# Patient Record
Sex: Female | Born: 1970 | Race: White | Hispanic: No | Marital: Married | Smoking: Never smoker
Health system: Southern US, Community
[De-identification: ages and names within clinical notes are randomized; demographics above are authoritative.]

## PROBLEM LIST (undated history)

## (undated) DIAGNOSIS — Z8679 Personal history of other diseases of the circulatory system: Secondary | ICD-10-CM

## (undated) DIAGNOSIS — E079 Disorder of thyroid, unspecified: Secondary | ICD-10-CM

## (undated) DIAGNOSIS — Z8719 Personal history of other diseases of the digestive system: Secondary | ICD-10-CM

## (undated) HISTORY — DX: Personal history of other diseases of the circulatory system: Z86.79

## (undated) HISTORY — DX: Disorder of thyroid, unspecified: E07.9

## (undated) HISTORY — PX: TONSILLECTOMY AND ADENOIDECTOMY: SUR1326

## (undated) HISTORY — PX: OTHER SURGICAL HISTORY: SHX169

## (undated) HISTORY — DX: Personal history of other diseases of the digestive system: Z87.19

---

## 2004-12-28 ENCOUNTER — Other Ambulatory Visit: Admission: RE | Admit: 2004-12-28 | Discharge: 2004-12-28 | Payer: Self-pay | Admitting: Gynecology

## 2005-12-30 ENCOUNTER — Other Ambulatory Visit: Admission: RE | Admit: 2005-12-30 | Discharge: 2005-12-30 | Payer: Self-pay | Admitting: Gynecology

## 2007-01-05 ENCOUNTER — Other Ambulatory Visit: Admission: RE | Admit: 2007-01-05 | Discharge: 2007-01-05 | Payer: Self-pay | Admitting: Gynecology

## 2008-03-06 ENCOUNTER — Encounter (INDEPENDENT_AMBULATORY_CARE_PROVIDER_SITE_OTHER): Payer: Self-pay | Admitting: Obstetrics and Gynecology

## 2008-03-06 ENCOUNTER — Inpatient Hospital Stay (HOSPITAL_COMMUNITY): Admission: RE | Admit: 2008-03-06 | Discharge: 2008-03-08 | Payer: Self-pay | Admitting: Obstetrics and Gynecology

## 2009-09-26 ENCOUNTER — Ambulatory Visit (HOSPITAL_COMMUNITY): Admission: RE | Admit: 2009-09-26 | Discharge: 2009-09-26 | Payer: Self-pay | Admitting: Obstetrics and Gynecology

## 2010-04-29 ENCOUNTER — Encounter: Admission: RE | Admit: 2010-04-29 | Discharge: 2010-04-29 | Payer: Self-pay | Admitting: Internal Medicine

## 2010-11-11 LAB — CBC
HCT: 39 % (ref 36.0–46.0)
Hemoglobin: 13.4 g/dL (ref 12.0–15.0)
RBC: 4.19 MIL/uL (ref 3.87–5.11)
RDW: 12.4 % (ref 11.5–15.5)
WBC: 6 10*3/uL (ref 4.0–10.5)

## 2011-01-05 NOTE — Op Note (Signed)
[name], Preston            ACCOUNT NO.:  [id number]   MEDICAL RECORD NO.:  [id number]          PATIENT TYPE:  INP   LOCATION:  9145                          FACILITY:  [facility]   PHYSICIAN:  Maxie Better, M.D.DATE OF BIRTH:  1971/07/23   DATE OF PROCEDURE:  03/06/2008  DATE OF DISCHARGE:                               OPERATIVE REPORT   PREOPERATIVE DIAGNOSES:  Term gestation, previous cesarean section x2,  and desires sterilization.   PROCEDURES:  Repeat cesarean section, Sharl Ma hysterotomy, modified Pomeroy  tubal ligation.   POSTOPERATIVE DIAGNOSES:  Previous cesarean section x2, term gestation,  and desires sterilization   ANESTHESIA:  Spinal.   SURGEON:  Maxie Better, MD   ASSISTANT:  Marlinda Mike, CNM   PROCEDURE:  Under adequate spinal anesthesia, the patient was placed in  the supine position with a left lateral tilt.  She was sterilely prepped  and draped in the usual fashion.  An indwelling Foley catheter was  sterilely placed.  A 0.25% Marcaine was injected along the planned  Pfannenstiel incision site.  Pfannenstiel skin incision was then made  and carried down to the rectus fascia.  Rectus fascia was opened  transversely.  The rectus fascia was then bluntly and sharply dissected  off the rectus muscle in superior and inferior fashion.  The rectus  muscles were split in midline.  The parietal peritoneum was opened  sharply and some adhesions was noted.  Careful dissection superiorly and  inferiorly was then performed, resulting in a small portion of the  serosal surface of the uterus being attached to the left anterior  abdominal wall and this was lysed in order to facilitate the surgery.  Additional adhesions on the left lower quadrant of the incision was also  lysed.  The vesicouterine peritoneum was then opened transversely.  The  bladder was then bluntly dissected off the lower uterine segment and  displaced inferiorly.  A curvilinear  low-transverse uterine incision was  then made and extended with bandage scissors.  Artificial rupture of  membranes and clear copious fluid was noted.  Subsequent delivery of a  live female from the left occiput transverse position was accomplished.  Baby was bulb suctioned in the abdomen.  Cord was clamped and cut.  The  baby was transferred to the awaiting pediatrician who assigned Apgars of  9 and 9 at 1 and 5.  Weight of the baby was ultimately 10 pounds.  Uterine incision was closed with a single layer of running locked stitch  of Monocryl with hemostasis noted.  Attention was then turned to the  fallopian tubes.  Both tubes were noted to be normal.  Both ovaries were  also normal.  The midportion of both fallopian tubes was grasped with a  single-tooth tenaculum.  The underlying mesosalpinx was opened with a  cautery.  The proximal and distal portion of the tube was then tied with  0 chromic x2 proximally and distally.  The intervening segment of tube  was then removed bilaterally.  The abdomen was then copiously irrigated  and suctioned of debris.  Re-inspection of the uterine incision showed  good  hemostasis.  The parietal peritoneum was closed with 2-0 Vicryl.  The rectus fascia was closed with 0 Vicryl x2.  The skin was  approximated using Ethicon staples after the subcutaneous area was  irrigated and small bleeders cauterized.   SPECIMENS:  Portion of right and left fallopian tubes sent to pathology.  Placenta not sent.   ESTIMATED BLOOD LOSS:  800 mL.   URINE OUTPUT:  100 mL, clear yellow urine.   INTRAOPERATIVE FLUID:  3 liters.   SPONGE AND INSTRUMENT COUNTS:  X2 was correct.   COMPLICATIONS:  None.   The patient tolerated the procedure well and was transferred to the  recovery room in stable condition.      Maxie Better, M.D.  Electronically Signed     [place]/MEDQ  D:  03/06/2008  T:  03/07/2008  Job:  [id number]

## 2011-01-08 NOTE — Discharge Summary (Signed)
Sherry Preston, Sherry Preston            ACCOUNT NO.:  [id number]   MEDICAL RECORD NO.:  [id number]          PATIENT TYPE:  INP   LOCATION:  9145                          [facility]ILITY:  [facility]   PHYSICIAN:  Maxie Better, M.D.DATE OF BIRTH:  [birth year]   DATE OF ADMISSION:  03/06/2008  DATE OF DISCHARGE:  03/08/2008                               DISCHARGE SUMMARY   ADMISSION DIAGNOSES:  1. Previous cesarean section.  2. Desired sterilization.  3. Term gestation.   DISCHARGE DIAGNOSES:  1. Term gestation, delivered.  2. Desired sterilization.  3. Previous cesarean section.   PROCEDURES:  1. Repeat cesarean section.  2. Modified Pomeroy tubal ligation.   HOSPITAL COURSE:  A 40 year old gravida 3, para 2 female at term  admitted for repeat cesarean section and tubal ligation.  The patient  had surgery done.  See the dictated operative report for the specific  details.  A live female, 10 pounds, Apgars of 9 and 9 was delivered.  The  patient had an uncomplicated postoperative course.  Her CBC on postop  day #1 showed a hemoglobin of 11.4, hematocrit of 33, white count of  10.2, and platelet count of 216,000.  On postop day #2, the patient  requested early discharge.  She was tolerating a regular diet.  She was  deemed likely to be able to go home.   DISPOSITION:  To home.   CONDITION:  Stable.   DISCHARGE MEDICATIONS:  1. Percocet 1-2 tablets every 3-4 hours p.r.n. pain.  2. Prenatal vitamins 1 p.o. daily.   FOLLOWUP APPOINTMENT:  [facility] in 3 days for staple removal and  in 6 weeks for postpartum checkup.   DISCHARGE INSTRUCTIONS:  Per the postpartum booklet given.      Maxie Better, M.D.  Electronically Signed     [place]/MEDQ  D:  03/28/2008  T:  03/28/2008  Job:  [id number]

## 2011-05-21 LAB — CBC
HCT: 33 — ABNORMAL LOW
HCT: 41.6
Hemoglobin: 11.4 — ABNORMAL LOW
Hemoglobin: 14.6
MCHC: 35
MCV: 96
MCV: 97.4
Platelets: 216
RBC: 4.33
RDW: 12.8
RDW: 13.1

## 2011-05-21 LAB — CCBB MATERNAL DONOR DRAW

## 2011-06-09 ENCOUNTER — Other Ambulatory Visit: Payer: Self-pay | Admitting: Family Medicine

## 2011-06-09 DIAGNOSIS — E049 Nontoxic goiter, unspecified: Secondary | ICD-10-CM

## 2011-06-14 ENCOUNTER — Ambulatory Visit
Admission: RE | Admit: 2011-06-14 | Discharge: 2011-06-14 | Disposition: A | Discharge status: Home / Self Care | Payer: BC Managed Care – PPO | Source: Ambulatory Visit | Attending: Family Medicine | Admitting: Family Medicine

## 2011-06-14 DIAGNOSIS — E049 Nontoxic goiter, unspecified: Secondary | ICD-10-CM

## 2011-06-16 ENCOUNTER — Other Ambulatory Visit: Payer: Self-pay

## 2012-06-26 ENCOUNTER — Other Ambulatory Visit: Payer: Self-pay | Admitting: Internal Medicine

## 2012-06-26 DIAGNOSIS — E049 Nontoxic goiter, unspecified: Secondary | ICD-10-CM

## 2012-07-04 ENCOUNTER — Ambulatory Visit
Admission: RE | Admit: 2012-07-04 | Discharge: 2012-07-04 | Disposition: A | Discharge status: Home / Self Care | Payer: BC Managed Care – PPO | Source: Ambulatory Visit | Attending: Internal Medicine | Admitting: Internal Medicine

## 2012-07-04 DIAGNOSIS — E049 Nontoxic goiter, unspecified: Secondary | ICD-10-CM

## 2013-01-24 ENCOUNTER — Other Ambulatory Visit: Payer: Self-pay | Admitting: Internal Medicine

## 2013-01-24 DIAGNOSIS — E049 Nontoxic goiter, unspecified: Secondary | ICD-10-CM

## 2013-02-15 ENCOUNTER — Ambulatory Visit
Admission: RE | Admit: 2013-02-15 | Discharge: 2013-02-15 | Disposition: A | Discharge status: Home / Self Care | Payer: BC Managed Care – PPO | Source: Ambulatory Visit | Attending: Internal Medicine | Admitting: Internal Medicine

## 2013-02-15 DIAGNOSIS — E049 Nontoxic goiter, unspecified: Secondary | ICD-10-CM

## 2013-03-26 ENCOUNTER — Other Ambulatory Visit: Payer: Self-pay | Admitting: Gastroenterology

## 2013-03-26 ENCOUNTER — Ambulatory Visit
Admission: RE | Admit: 2013-03-26 | Discharge: 2013-03-26 | Disposition: A | Discharge status: Home / Self Care | Payer: BC Managed Care – PPO | Source: Ambulatory Visit | Attending: Gastroenterology | Admitting: Gastroenterology

## 2013-03-26 DIAGNOSIS — R935 Abnormal findings on diagnostic imaging of other abdominal regions, including retroperitoneum: Secondary | ICD-10-CM

## 2013-03-26 DIAGNOSIS — R1031 Right lower quadrant pain: Secondary | ICD-10-CM

## 2013-03-26 MED ORDER — IOHEXOL 300 MG/ML  SOLN
100.0000 mL | Freq: Once | INTRAMUSCULAR | Status: AC | PRN
  Administered 2013-03-26: 100 mL via INTRAVENOUS

## 2013-03-30 ENCOUNTER — Other Ambulatory Visit: Payer: BC Managed Care – PPO

## 2013-03-31 ENCOUNTER — Ambulatory Visit
Admission: RE | Admit: 2013-03-31 | Discharge: 2013-03-31 | Disposition: A | Discharge status: Home / Self Care | Payer: BC Managed Care – PPO | Source: Ambulatory Visit | Attending: Gastroenterology | Admitting: Gastroenterology

## 2013-03-31 DIAGNOSIS — R1031 Right lower quadrant pain: Secondary | ICD-10-CM

## 2013-03-31 DIAGNOSIS — R935 Abnormal findings on diagnostic imaging of other abdominal regions, including retroperitoneum: Secondary | ICD-10-CM

## 2013-03-31 MED ORDER — GADOXETATE DISODIUM 0.25 MMOL/ML IV SOLN
7.0000 mL | Freq: Once | INTRAVENOUS | Status: AC | PRN
  Administered 2013-03-31: 7 mL via INTRAVENOUS

## 2013-04-02 ENCOUNTER — Other Ambulatory Visit: Payer: BC Managed Care – PPO

## 2014-08-01 IMAGING — CT CT ABD-PELV W/ CM
2 of 5 series · 16 of 46 positions shown, 18 images · IV contrast (READICAT/WATER & [ID] OMNI 300)
Comparison: No priors.

CLINICAL DATA: Right lower quadrant abdominal pain.

CT ABDOMEN AND PELVIS WITH CONTRAST
TECHNIQUE: Multidetector CT imaging of the abdomen and pelvis was
performed following the standard protocol during bolus
administration of intravenous contrast.
Contrast: 100mL OMNIPAQUE IOHEXOL 300 MG/ML  SOLN

[Series 2: abd/pelvis with · axial · 0.70mm/px · z∈[-296,+89]mm · 13 of 87 slices shown, 15 images]
[im 5/87  soft-tissue]
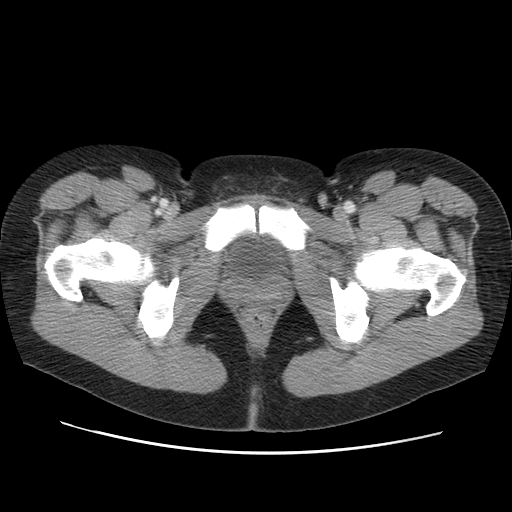
[im 5/87  bone]
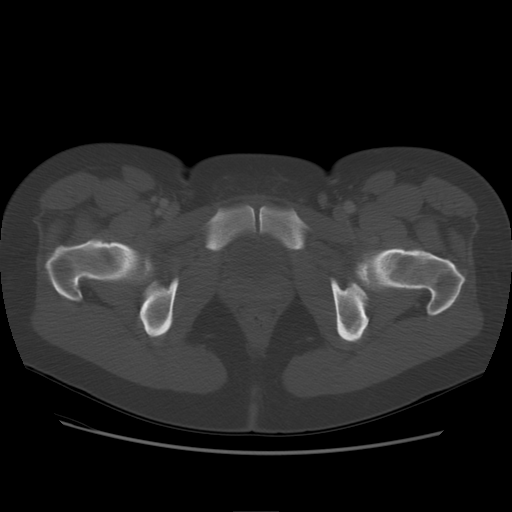
[im 14/87  soft-tissue]
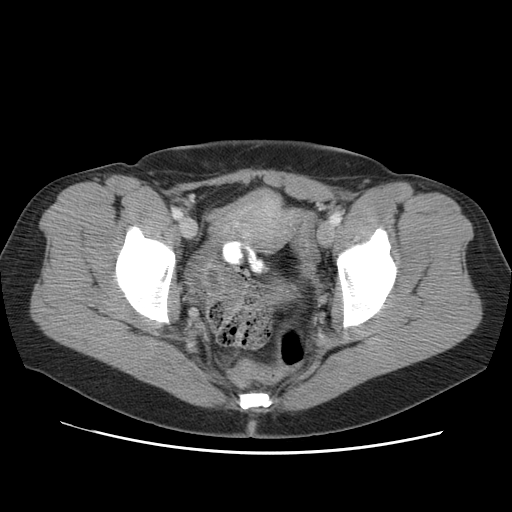
[im 19/87  soft-tissue]
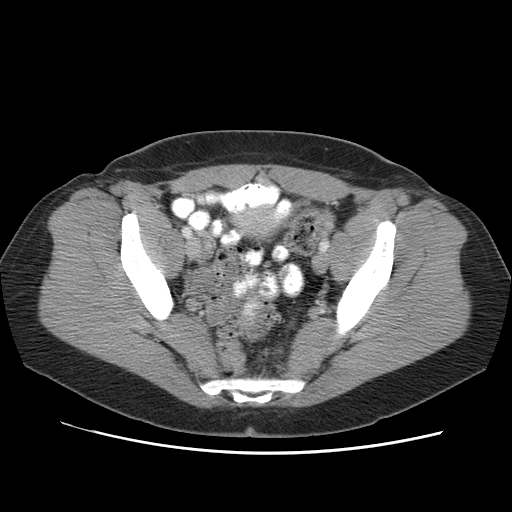
[im 23/87  soft-tissue]
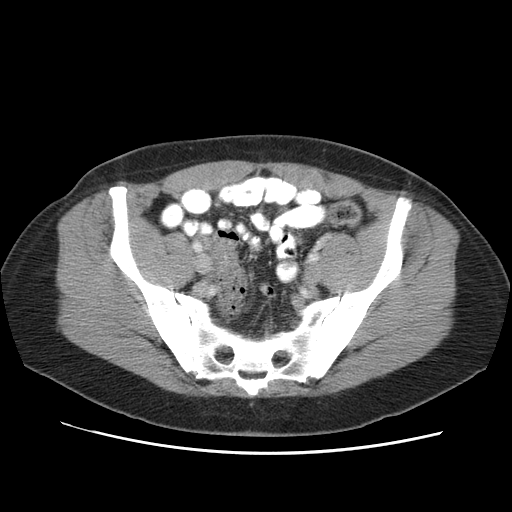
[im 32/87  soft-tissue]
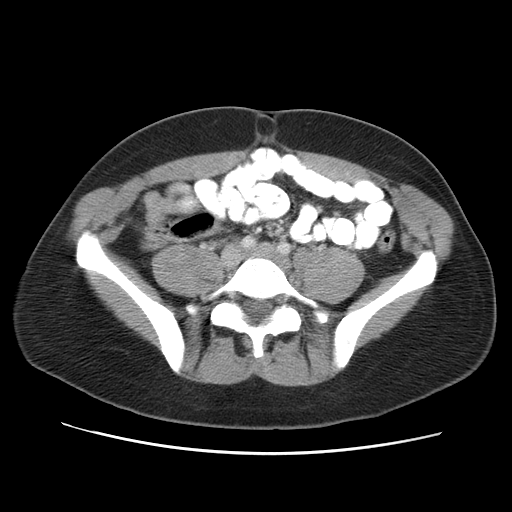
[im 37/87  soft-tissue]
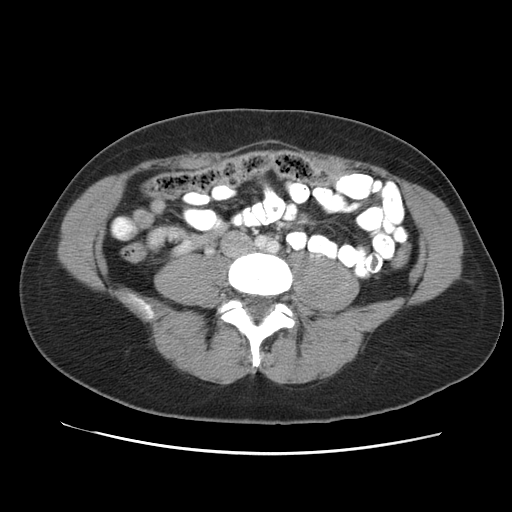
[im 46/87  soft-tissue]
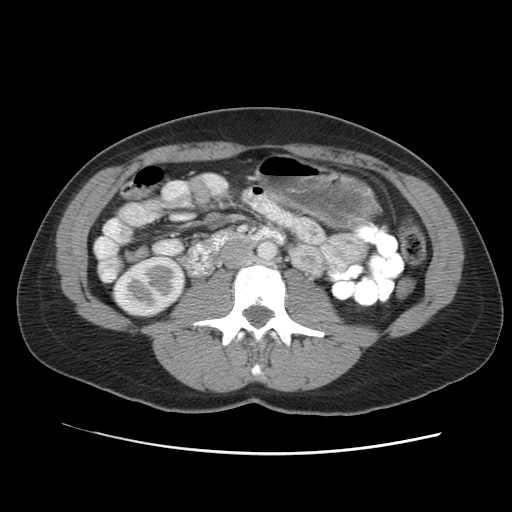
[im 50/87  soft-tissue]
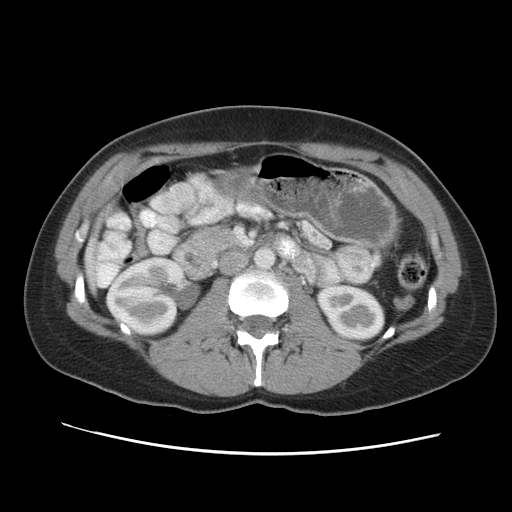
[im 55/87  soft-tissue]
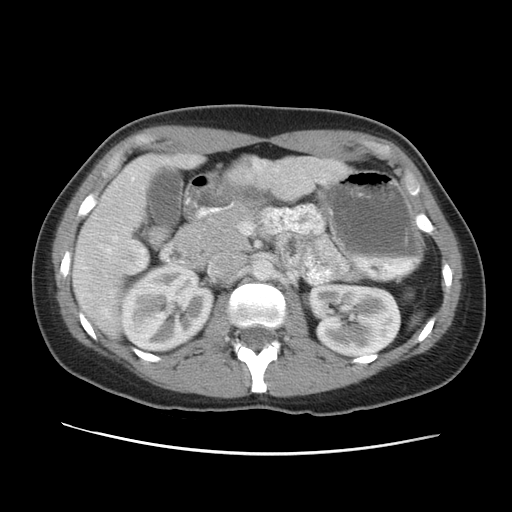
[im 55/87  bone]
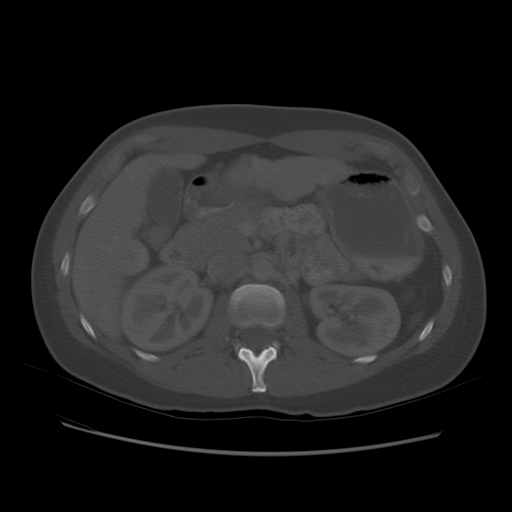
[im 64/87  soft-tissue]
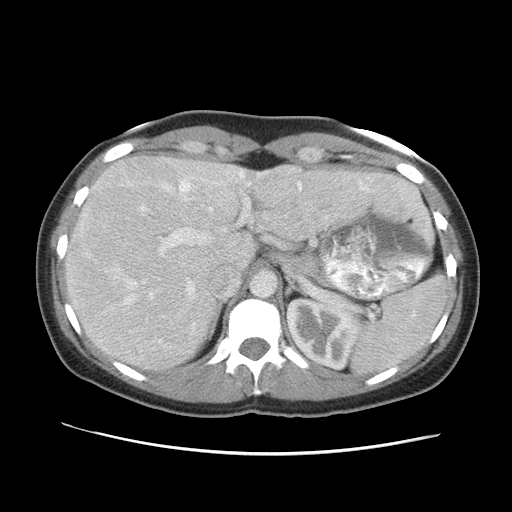
[im 68/87  soft-tissue]
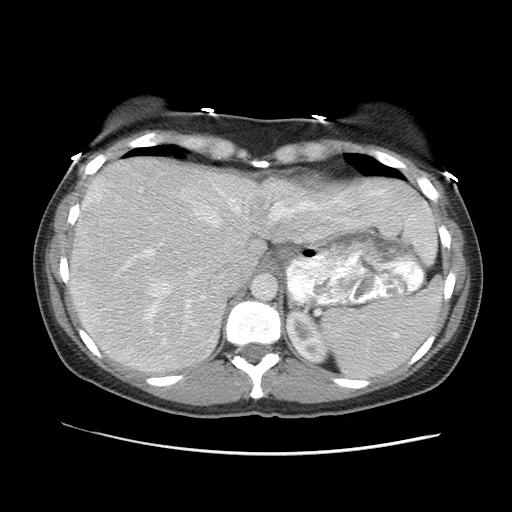
[im 73/87  soft-tissue]
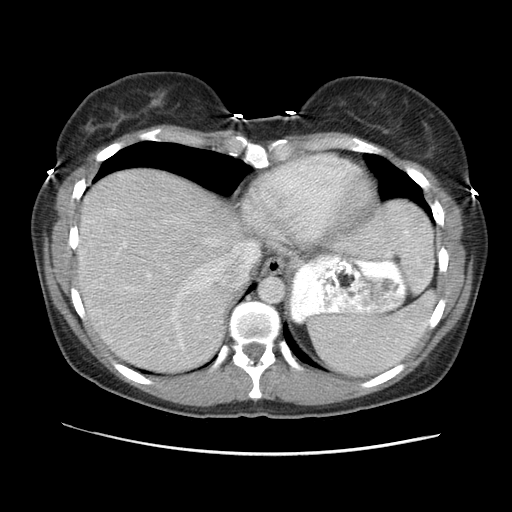
[im 82/87  soft-tissue]
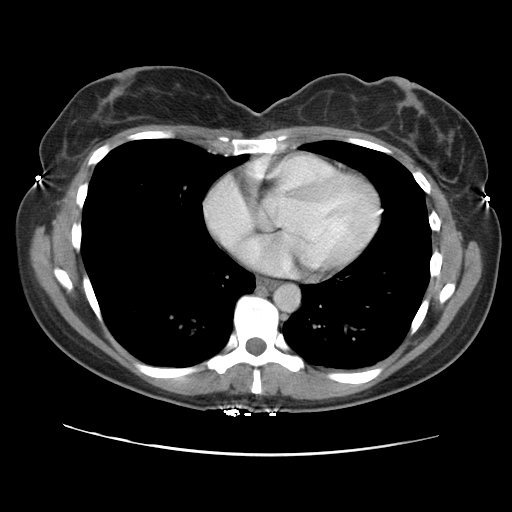

[Series 400: coronal · coronal · 0.95mm/px · 3 of 104 slices shown]
[im 35/104  soft-tissue]
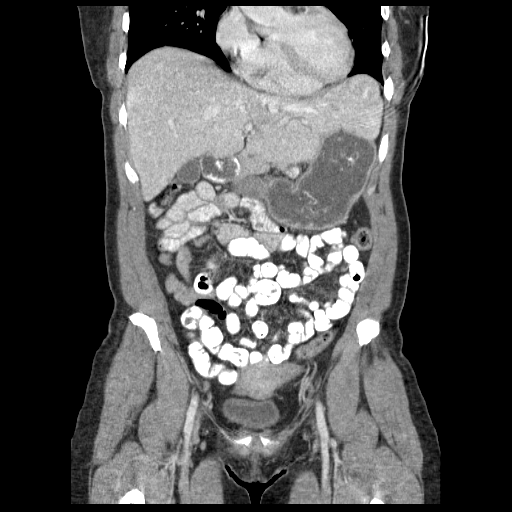
[im 46/104  soft-tissue]
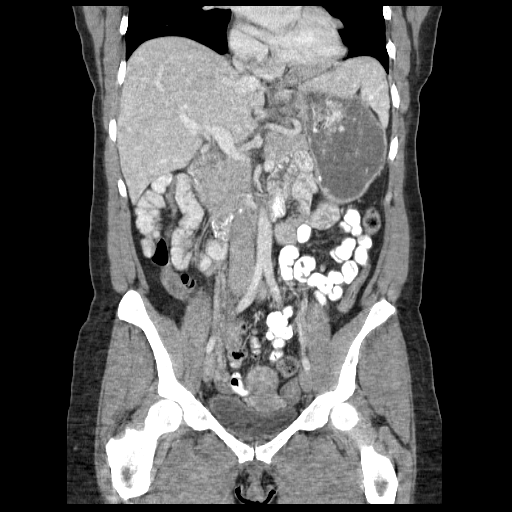
[im 58/104  soft-tissue]
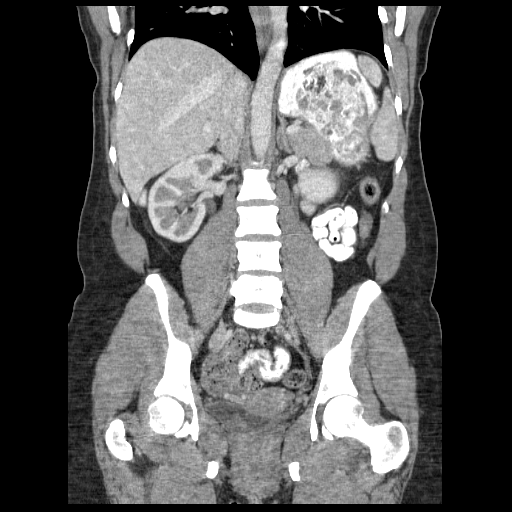

[16 of 46 positions shown; findings below may reference images not displayed]

FINDINGS: Lung Bases: Unremarkable.

Abdomen/Pelvis:  The left lobe of the liver is unusual in
appearance.  Specifically, appears shrunken with a nodular contour.
There is a suggestion of some areas of low attenuation within this
region on delayed images, however, this is not certain.  There is
also a suggestion of a trace amount of intrahepatic biliary ductal
dilatation within the left lobe within segment two.

The appearance of the gallbladder, pancreas, spleen, bilateral
adrenal glands and bilateral kidneys is unremarkable.  Normal
appendix.  No significant volume of ascites.  No pneumoperitoneum.
No pathologic distension of small bowel.  No definite pathologic
lymphadenopathy identified within the abdomen or pelvis.  Uterus
and ovaries are unremarkable in appearance.

Musculoskeletal: There are no aggressive appearing lytic or blastic
lesions noted in the visualized portions of the skeleton.
IMPRESSION: 1.  No acute findings to account for the patient's right lower
quadrant abdominal pain.
2.  Specifically, the appendix is normal.
3.  Very unusual appearance of the left lobe of the liver, as
detailed above.  This is of uncertain etiology and significance.
Differential considerations include early changes of cirrhosis
(however, isolated involvement of the left lobe of the liver would
be rather unusual), benign lesions such as focal nodular
hyperplasia (FNH), or aggressive neoplasm. For further
characterization of this finding, MRI of the abdomen with and
without Gadolinium (Eovist) is recommended.

These results will be called to the ordering clinician or
representative by the Radiologist Assistant, and communication
documented in the PACS Dashboard.

## 2016-04-27 ENCOUNTER — Other Ambulatory Visit: Payer: Self-pay | Admitting: Radiology

## 2018-01-11 ENCOUNTER — Ambulatory Visit: Payer: Self-pay | Admitting: Women's Health

## 2018-02-01 ENCOUNTER — Ambulatory Visit (INDEPENDENT_AMBULATORY_CARE_PROVIDER_SITE_OTHER): Payer: PRIVATE HEALTH INSURANCE | Admitting: Women's Health

## 2018-02-01 ENCOUNTER — Other Ambulatory Visit: Payer: Self-pay | Admitting: Women's Health

## 2018-02-01 ENCOUNTER — Encounter: Payer: Self-pay | Admitting: Women's Health

## 2018-02-01 VITALS — BP 120/78 | Ht 65.0 in | Wt 153.0 lb

## 2018-02-01 DIAGNOSIS — Z01419 Encounter for gynecological examination (general) (routine) without abnormal findings: Secondary | ICD-10-CM

## 2018-02-01 DIAGNOSIS — Z8639 Personal history of other endocrine, nutritional and metabolic disease: Secondary | ICD-10-CM

## 2018-02-01 DIAGNOSIS — R102 Pelvic and perineal pain: Secondary | ICD-10-CM | POA: Diagnosis not present

## 2018-02-01 LAB — TSH: TSH: 1.47 mIU/L

## 2018-02-01 MED ORDER — NONFORMULARY OR COMPOUNDED ITEM: 1 refills | Status: DC

## 2018-02-01 NOTE — Progress Notes (Signed)
la 

## 2018-02-01 NOTE — Progress Notes (Signed)
[facility] 1970-12-15 [id number]    History:    Presents for annual exam.  Mostly monthly cycles, BTL, endometrial ablation 2011.  Occasional heavy cycle but much better than prior to ablation.  Normal Pap and mammogram history.  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  Was last seen here in 2008. History of thyroid problem, no med.  Past medical history, past surgical history, family history and social history were all reviewed and documented in the EPIC chart.  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  Father deceased from bladder cancer.  ROS:  A ROS was performed and pertinent positives and negatives are included.  Exam:  Vitals:   02/01/18 0949  BP: 120/78  Weight: 153 lb (69.4 kg)  Height: 5\' 5"  (1.651 m)   Body mass index is 25.46 kg/m.   General appearance:  Normal Thyroid:  Symmetrical, normal in size, without palpable masses or nodularity. Respiratory  Auscultation:  Clear without wheezing or rhonchi Cardiovascular  Auscultation:  Regular rate, without rubs, murmurs or gallops  Edema/varicosities:  Not grossly evident Abdominal  Soft,nontender, without masses, guarding or rebound.  Liver/spleen:  No organomegaly noted  Hernia:  None appreciated  Skin  Inspection:  Grossly normal   Breasts: Examined lying and sitting.     Right: Without masses, retractions, discharge or axillary adenopathy.     Left: Without masses, retractions, discharge or axillary adenopathy. Gentitourinary   Inguinal/mons:  Normal without inguinal adenopathy  External genitalia:  Normal  BUS/Urethra/Skene's glands:  Normal  Vagina:  Normal  Cervix:  Normal  Uterus:  normal in size, shape and contour.  Midline and mobile  Adnexa/parametria:     Rt: Without masses or tenderness.   Lt: Without masses or tenderness.  Anus and perineum: Normal  Digital rectal exam: Normal sphincter tone without palpated masses or tenderness  Assessment/Plan:  47 y.o. MWF G3 P3 for annual exam with  complaint of right lower quadrant stabbing pain with menstrual cycle.  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  SBE's, continue annual screening mammogram, calcium rich foods, vitamin D 1000 daily encouraged.  Instructed to call if cycles closer than 21 days.  Continue regular exercise of weights and walking.  CBC, CMP, TSH, UA, Pap with HR HPV typing.  New screening guidelines reviewed.  Instructed to come fasting next year we will check a lipid panel.  Diltiazem Gel 2% to custom care per request for occasional rectal pain    Harrington Challengerancy J Qamar [facility] HospitalWHNP, 11:11 AM 02/01/2018

## 2018-02-01 NOTE — Patient Instructions (Signed)

## 2018-02-02 ENCOUNTER — Encounter (INDEPENDENT_AMBULATORY_CARE_PROVIDER_SITE_OTHER): Payer: Self-pay

## 2018-02-02 LAB — CBC WITH DIFFERENTIAL/PLATELET
BASOS ABS: 32 {cells}/uL (ref 0–200)
BASOS PCT: 0.4 %
Eosinophils Absolute: 48 cells/uL (ref 15–500)
Eosinophils Relative: 0.6 %
HEMATOCRIT: 39.1 % (ref 35.0–45.0)
HEMOGLOBIN: 13.6 g/dL (ref 11.7–15.5)
Lymphs Abs: 1376 cells/uL (ref 850–3900)
MCH: 31.6 pg (ref 27.0–33.0)
MCHC: 34.8 g/dL (ref 32.0–36.0)
MCV: 90.9 fL (ref 80.0–100.0)
MONOS PCT: 7 %
MPV: 9.8 fL (ref 7.5–12.5)
NEUTROS ABS: 5984 {cells}/uL (ref 1500–7800)
Neutrophils Relative %: 74.8 %
Platelets: 297 10*3/uL (ref 140–400)
RBC: 4.3 10*6/uL (ref 3.80–5.10)
RDW: 12.3 % (ref 11.0–15.0)
Total Lymphocyte: 17.2 %
WBC: 8 10*3/uL (ref 3.8–10.8)
WBCMIX: 560 {cells}/uL (ref 200–950)

## 2018-02-02 LAB — URINALYSIS, COMPLETE W/RFL CULTURE
BACTERIA UA: NONE SEEN /HPF
Bilirubin Urine: NEGATIVE
Glucose, UA: NEGATIVE
HGB URINE DIPSTICK: NEGATIVE
HYALINE CAST: NONE SEEN /LPF
Ketones, ur: NEGATIVE
Leukocyte Esterase: NEGATIVE
Nitrites, Initial: NEGATIVE
PROTEIN: NEGATIVE
RBC / HPF: NONE SEEN /HPF (ref 0–2)
Specific Gravity, Urine: 1.006 (ref 1.001–1.03)
Squamous Epithelial / LPF: NONE SEEN /HPF (ref <=5)
WBC UA: NONE SEEN /HPF (ref 0–5)
pH: 6 (ref 5.0–8.0)

## 2018-02-02 LAB — PAP, TP IMAGING W/ HPV RNA, RFLX HPV TYPE 16,18/45: HPV DNA HIGH RISK: NOT DETECTED

## 2018-02-02 LAB — COMPREHENSIVE METABOLIC PANEL
AG RATIO: 1.7 (calc) (ref 1.0–2.5)
ALKALINE PHOSPHATASE (APISO): 121 U/L — AB (ref 33–115)
ALT: 18 U/L (ref 6–29)
AST: 18 U/L (ref 10–35)
Albumin: 4.3 g/dL (ref 3.6–5.1)
BILIRUBIN TOTAL: 1 mg/dL (ref 0.2–1.2)
BUN: 12 mg/dL (ref 7–25)
CO2: 27 mmol/L (ref 20–32)
CREATININE: 0.8 mg/dL (ref 0.50–1.10)
Calcium: 9.8 mg/dL (ref 8.6–10.2)
Chloride: 104 mmol/L (ref 98–110)
GLOBULIN: 2.6 g/dL (ref 1.9–3.7)
GLUCOSE: 98 mg/dL (ref 65–99)
Potassium: 4.6 mmol/L (ref 3.5–5.3)
SODIUM: 141 mmol/L (ref 135–146)
Total Protein: 6.9 g/dL (ref 6.1–8.1)

## 2018-02-02 LAB — NO CULTURE INDICATED

## 2018-02-09 ENCOUNTER — Encounter: Payer: Self-pay | Admitting: Women's Health

## 2018-05-22 ENCOUNTER — Encounter: Payer: Self-pay | Admitting: Women's Health

## 2018-09-18 ENCOUNTER — Ambulatory Visit: Payer: PRIVATE HEALTH INSURANCE | Admitting: Women's Health

## 2018-09-18 ENCOUNTER — Encounter: Payer: Self-pay | Admitting: Women's Health

## 2018-09-18 VITALS — BP 118/80

## 2018-09-18 DIAGNOSIS — R1031 Right lower quadrant pain: Secondary | ICD-10-CM

## 2018-09-18 MED ORDER — NORETHINDRONE 0.35 MG PO TABS: 1.0000 | ORAL_TABLET | Freq: Every day | ORAL | 2 refills | Status: DC

## 2018-09-18 NOTE — Patient Instructions (Signed)

## 2018-09-18 NOTE — Progress Notes (Signed)
48 year old MWF G3, P3 presents with complaint of irregular spotting, dark brown unpredictable for several months.  Right lower quadrant intermittent abdominal pain occasional stabbing type pain rates at a 10, no pain today.   Has had this type of intermittent pain for many years, negative CT scan, ultrasound showed resolving cysts in the past.  Denies urinary symptoms, vaginal discharge, vaginal irritation, fever, nausea, or changes in bowel elimination.  Denies any menopausal symptoms.  Homemaker.  2011 endometrial ablation history of BTL.  Reports good relief of menorrhagia with ablation but has had increased irregular bleeding this past year.  Exam: Appears well.  No CVAT.  Abdomen soft, nontender, no rebound or radiation, external genitalia within normal limits, speculum exam dark brown blood without odor, vaginal walls pink without erythema.  Bimanual no CMT or tenderness with exam, slight tenderness right lower quadrant with deep palpation.  Rectal exam negative.  Irregular bleeding status post endometrial ablation/BTL Intermittent right lower quadrant pain-years  Plan: Instructed to schedule ultrasound with pain.  Will try Micronor, prescription, proper use, reviewed no placebo week, will start today, instructed to call if continued bleeding/spotting.

## 2019-02-02 ENCOUNTER — Other Ambulatory Visit: Payer: Self-pay

## 2019-02-05 ENCOUNTER — Ambulatory Visit (INDEPENDENT_AMBULATORY_CARE_PROVIDER_SITE_OTHER): Payer: 59 | Admitting: Women's Health

## 2019-02-05 ENCOUNTER — Encounter: Payer: Self-pay | Admitting: Women's Health

## 2019-02-05 ENCOUNTER — Other Ambulatory Visit: Payer: Self-pay

## 2019-02-05 VITALS — BP 122/80 | Ht 65.0 in | Wt 147.0 lb

## 2019-02-05 DIAGNOSIS — Z8742 Personal history of other diseases of the female genital tract: Secondary | ICD-10-CM | POA: Diagnosis not present

## 2019-02-05 DIAGNOSIS — E038 Other specified hypothyroidism: Secondary | ICD-10-CM | POA: Diagnosis not present

## 2019-02-05 DIAGNOSIS — I780 Hereditary hemorrhagic telangiectasia: Secondary | ICD-10-CM

## 2019-02-05 DIAGNOSIS — Z01419 Encounter for gynecological examination (general) (routine) without abnormal findings: Secondary | ICD-10-CM

## 2019-02-05 MED ORDER — VALACYCLOVIR HCL 500 MG PO TABS: ORAL_TABLET | ORAL | 6 refills | Status: DC

## 2019-02-05 MED ORDER — NORETHINDRONE 0.35 MG PO TABS: 1.0000 | ORAL_TABLET | Freq: Every day | ORAL | 4 refills | Status: DC

## 2019-02-05 NOTE — Patient Instructions (Signed)

## 2019-02-05 NOTE — Progress Notes (Signed)
Sherry Preston [facility] 1970/12/05 [id number]    History:    Presents for annual exam.  Light monthly cycle on Micronor, BTL, 2011 endometrial ablation.  History of menorrhagia.  Normal Pap and mammogram history.  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  Primary care manages labs.  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  History of hypothyroidism on no meds.. History of hereditary hemorrhagic telangiectasia -managed at [place] biggest problem has been nosebleeds in the past  Past medical history, past surgical history, family history and social history were all reviewed and documented in the EPIC chart.  Homemaker, 3 sons ages 72, 43 and 91 all doing well oldest 2 have had Gardasil.  ROS:  A ROS was performed and pertinent positives and negatives are included.  Exam:  Vitals:   02/05/19 1113  BP: 122/80  Weight: 147 lb (66.7 kg)  Height: 5\' 5"  (1.651 m)   Body mass index is 24.46 kg/m.   General appearance:  Normal Thyroid:  Symmetrical, normal in size, without palpable masses or nodularity. Respiratory  Auscultation:  Clear without wheezing or rhonchi Cardiovascular  Auscultation:  Regular rate, without rubs, murmurs or gallops  Edema/varicosities:  Not grossly evident Abdominal  Soft,nontender, without masses, guarding or rebound.  Liver/spleen:  No organomegaly noted  Hernia:  None appreciated  Skin  Inspection:  Grossly normal   Breasts: Examined lying and sitting.     Right: Without masses, retractions, discharge or axillary adenopathy.     Left: Without masses, retractions, discharge or axillary adenopathy. Gentitourinary   Inguinal/mons:  Normal without inguinal adenopathy  External genitalia:  Normal  BUS/Urethra/Skene's glands:  Normal  Vagina:  Normal  Cervix:  Normal  Uterus:   normal in size, shape and contour.  Midline and mobile  Adnexa/parametria:     Rt: Without masses or tenderness.   Lt: Without masses or tenderness.  Anus and perineum: Normal  Digital rectal  exam: Normal sphincter tone without palpated masses or tenderness  Assessment/Plan:  48 y.o. MWF G3, P3 for annual exam with no complaints.  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  Pap normal 2019, new screening guidelines reviewed.  SBEs, continue annual screening mammogram, calcium rich foods, vitamin D 1000 daily encouraged.  Micronor prescription, proper use given and reviewed.  Denies any menopausal symptoms.  Valtrex 500 twice daily for 3 to 5 days as needed prescription, proper use given and reviewed.   [place], 1:24 PM 02/05/2019

## 2019-02-06 LAB — CBC WITH DIFFERENTIAL/PLATELET
Absolute Monocytes: 421 cells/uL (ref 200–950)
Basophils Absolute: 29 cells/uL (ref 0–200)
Basophils Relative: 0.6 %
Eosinophils Absolute: 78 cells/uL (ref 15–500)
Eosinophils Relative: 1.6 %
HCT: 41.1 % (ref 35.0–45.0)
Hemoglobin: 14.4 g/dL (ref 11.7–15.5)
Lymphs Abs: 1421 cells/uL (ref 850–3900)
MCH: 32.7 pg (ref 27.0–33.0)
MCHC: 35 g/dL (ref 32.0–36.0)
MCV: 93.2 fL (ref 80.0–100.0)
MPV: 9.9 fL (ref 7.5–12.5)
Monocytes Relative: 8.6 %
Neutro Abs: 2950 cells/uL (ref 1500–7800)
Neutrophils Relative %: 60.2 %
Platelets: 336 10*3/uL (ref 140–400)
RBC: 4.41 10*6/uL (ref 3.80–5.10)
RDW: 11.7 % (ref 11.0–15.0)
Total Lymphocyte: 29 %
WBC: 4.9 10*3/uL (ref 3.8–10.8)

## 2019-02-06 LAB — COMPREHENSIVE METABOLIC PANEL
AG Ratio: 1.9 (calc) (ref 1.0–2.5)
ALT: 20 U/L (ref 6–29)
AST: 21 U/L (ref 10–35)
Albumin: 4.7 g/dL (ref 3.6–5.1)
Alkaline phosphatase (APISO): 111 U/L (ref 31–125)
BUN: 13 mg/dL (ref 7–25)
CO2: 26 mmol/L (ref 20–32)
Calcium: 10.4 mg/dL — ABNORMAL HIGH (ref 8.6–10.2)
Chloride: 103 mmol/L (ref 98–110)
Creat: 0.89 mg/dL (ref 0.50–1.10)
Globulin: 2.5 g/dL (calc) (ref 1.9–3.7)
Glucose, Bld: 93 mg/dL (ref 65–99)
Potassium: 4.5 mmol/L (ref 3.5–5.3)
Sodium: 140 mmol/L (ref 135–146)
Total Bilirubin: 1.3 mg/dL — ABNORMAL HIGH (ref 0.2–1.2)
Total Protein: 7.2 g/dL (ref 6.1–8.1)

## 2019-02-06 LAB — TSH: TSH: 2.28 mIU/L

## 2019-06-13 ENCOUNTER — Other Ambulatory Visit: Payer: Self-pay | Admitting: Women's Health

## 2019-06-19 ENCOUNTER — Encounter: Payer: Self-pay | Admitting: Women's Health

## 2019-09-21 ENCOUNTER — Other Ambulatory Visit: Payer: Self-pay

## 2019-09-21 ENCOUNTER — Ambulatory Visit (INDEPENDENT_AMBULATORY_CARE_PROVIDER_SITE_OTHER): Payer: Self-pay

## 2019-09-21 ENCOUNTER — Ambulatory Visit (INDEPENDENT_AMBULATORY_CARE_PROVIDER_SITE_OTHER): Payer: 59 | Admitting: Orthopedic Surgery

## 2019-09-21 DIAGNOSIS — M25512 Pain in left shoulder: Secondary | ICD-10-CM

## 2019-09-21 DIAGNOSIS — M7502 Adhesive capsulitis of left shoulder: Secondary | ICD-10-CM | POA: Diagnosis not present

## 2019-09-21 MED ORDER — HYDROCODONE-ACETAMINOPHEN 5-325 MG PO TABS: 1.0000 | ORAL_TABLET | Freq: Four times a day (QID) | ORAL | 0 refills | Status: DC | PRN

## 2019-09-21 NOTE — Progress Notes (Signed)
Subjective: Patient is here for ultrasound-guided intra-articular left glenohumeral injection.  She has adhesive capsulitis.  Family history of diabetes but she does not have it.  She does have Hashimoto's thyroiditis.  Objective: Left shoulder has decreased range of motion with overhead overhead reach.  Procedure: Ultrasound-guided left glenohumeral injection: After sterile prep with Betadine, injected 8 cc 1% lidocaine without epinephrine and 40 mg methylprednisolone using a 22-gauge spinal needle, passing the needle through approach into the glenohumeral joint.  Injectate seen filling the joint capsule.  Good immediate relief, with improvement in overhead reach.

## 2019-09-22 ENCOUNTER — Encounter: Payer: Self-pay | Admitting: Orthopedic Surgery

## 2019-09-22 NOTE — Progress Notes (Signed)
Office Visit Note   Patient: Sherry Preston           Date of Birth: 08/27/1970           MRN: [id number] Visit Date: 09/21/2019 Requested by: Lewis Moccasin, MD [address] [place],  [place] [address] PCP: Lewis Moccasin, MD  Subjective: Chief Complaint  Patient presents with  . Shoulder Pain    HPI: Sherry Preston is a patient with left shoulder pain of 9 months duration.  Is been worse since the summer 2020.  She has been to see another provider who tried an injection and physical therapy without much relief.  The injection sounds like it was a subacromial injection but records not available to review.  Now she describes decreased range of motion.  Unable to hook her bra.  She is right-hand dominant.  Pain is worse in the morning.  Reports anterior pain.  Does wake her from sleep at night.  She uses Advil and Voltaren gel with some relief.  Denies any radicular symptoms.  She has some occasional neck pain which is helped by chiropractor.  She is not exercising much.  No symptoms below the elbow and no mechanical symptoms in the shoulder.              ROS: All systems reviewed are negative as they relate to the chief complaint within the history of present illness.  Patient denies  fevers or chills.   Assessment & Plan: Visit Diagnoses:  1. Left shoulder pain, unspecified chronicity   2. Adhesive capsulitis of left shoulder     Plan: Impression is left frozen shoulder with restriction of passive range of motion and normal rotator cuff strength on exam and normal radiographs by her report.  Plan is home exercise program with intra-articular cortisone shot performed today by Dr. Prince Rome.  She is going to have to suffer a little bit in order to get her range of motion back but that should shorten the natural history of this problem.  Come back in 6 weeks for repeat clinical assessment and decision for or against repeat injection as well as formal physical therapy.  Follow-Up  Instructions: Return in about 6 weeks (around 11/02/2019).   Orders:  Orders Placed This Encounter  Procedures  . US Guided Needle Placement - No Linked Charges   Meds ordered this encounter  Medications  . HYDROcodone-acetaminophen (NORCO/VICODIN) 5-325 MG tablet    Sig: Take 1 tablet by mouth every 6 (six) hours as needed for moderate pain.    Dispense:  20 tablet    Refill:  0      Procedures: No procedures performed   Clinical Data: No additional findings.  Objective: Vital Signs: There were no vitals taken for this visit.  Physical Exam:   Constitutional: Patient appears well-developed HEENT:  Head: Normocephalic Eyes:EOM are normal Neck: Normal range of motion Cardiovascular: Normal rate Pulmonary/chest: Effort normal Neurologic: Patient is alert Skin: Skin is warm Psychiatric: Patient has normal mood and affect    Ortho Exam: Ortho exam demonstrates good cervical spine range of motion with mild tenderness in the strap muscles of the neck on the left.  Motor or sensory function of the hand is intact.  No paresthesias C5-T1.  Radial pulses intact.  No masses lymphadenopathy or skin changes noted in that left shoulder girdle region.  She has diminished external rotation of 15 degrees of abduction on the left compared to the right.  On the  right it is about 85 degrees on the left it is about 50 degrees.  Isolated glenohumeral forward flexion on the right 100 on the left 75 forward flexion on the right 180 on the left 160.  Specialty Comments:  No specialty comments available.  Imaging: US Guided Needle Placement - No Linked Charges  Result Date: 09/21/2019 Please see Notes tab for imaging impression.    PMFS History: Patient Active Problem List   Diagnosis Date Noted  . 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 Hx of menorrhagia 02/05/2019   Past Medical History:  Diagnosis Date  . H/O hereditary hemorrhagic telangiectasia (HHT)   .  History of IBS   . Thyroid disease     Family History  Problem Relation Age of Onset  . Stroke Mother   . Hypertension Mother   . Bladder Cancer Father   . Diabetes Mellitus I Sister   . Diabetes Mellitus II Sister   . Heart disease Maternal Grandfather   . Heart disease Paternal Grandfather   . Diabetes Mellitus I Paternal Grandfather   . Diabetes Mellitus II Sister   . Cervical cancer Sister     Past Surgical History:  Procedure Laterality Date  . CESAREAN SECTION    . TONSILLECTOMY AND ADENOIDECTOMY    . uterine ablation     Social History   Occupational History  . Not on file  Tobacco Use  . Smoking status: Never Smoker  . Smokeless tobacco: Never Used  Substance and Sexual Activity  . Alcohol use: Yes  . Drug use: Never  . Sexual activity: Yes    Comment: declined insurance questions,

## 2019-11-02 ENCOUNTER — Ambulatory Visit (INDEPENDENT_AMBULATORY_CARE_PROVIDER_SITE_OTHER): Payer: 59 | Admitting: Orthopedic Surgery

## 2019-11-02 ENCOUNTER — Other Ambulatory Visit: Payer: Self-pay

## 2019-11-02 ENCOUNTER — Encounter: Payer: Self-pay | Admitting: Orthopedic Surgery

## 2019-11-02 DIAGNOSIS — M7502 Adhesive capsulitis of left shoulder: Secondary | ICD-10-CM

## 2019-11-02 NOTE — Progress Notes (Signed)
Office Visit Note   Patient: Sherry Preston           Date of Birth: 09-24-1970           MRN: [id number] Visit Date: 11/02/2019 Requested by: Lewis Moccasin, MD [address] [place],  [place] [address] PCP: Lewis Moccasin, MD  Subjective: Chief Complaint  Patient presents with  . Left Shoulder - Follow-up    HPI: Sherry Preston is a 49 y.o. female who presents to the office complaining of left shoulder pain.  Patient returns following intra-articular injection by Dr. Prince Rome on 09/21/2019.  She notes that she is doing a lot better with increased range of motion of the shoulder.  She still has issues with reaching behind her but she has very minimal pain these days.  She does not wake up with pain any longer.  She has had 2 deep tissue massages that she states have helped.  She has been doing a home exercise program but admits that she has slacked off to some degree.  She is taking Advil as needed, and taking it less frequently than she was at her last appointment..                ROS:  All systems reviewed are negative as they relate to the chief complaint within the history of present illness.  Patient denies fevers or chills.  Assessment & Plan: Visit Diagnoses:  1. Adhesive capsulitis of left shoulder     Plan: Patient is a 49 year old female who presents complaining of left shoulder pain.  She returns for reevaluation of left shoulder adhesive capsulitis.  She has significantly decreased pain and increased range of motion following intra-articular injection with home exercise program.  She is lacking about 5 degrees of abduction, 10 degrees of external rotation compared with contralateral side.  She is also lacking about 5 vertebral levels of internal rotation behind her back compared with contralateral side.  This should continue to improve as she does a home exercise program.  She will return to the office as needed.  Follow-Up Instructions: No follow-ups on file.    Orders:  No orders of the defined types were placed in this encounter.  No orders of the defined types were placed in this encounter.     Procedures: No procedures performed   Clinical Data: No additional findings.  Objective: Vital Signs: There were no vitals taken for this visit.  Physical Exam:  Constitutional: Patient appears well-developed HEENT:  Head: Normocephalic Eyes:EOM are normal Neck: Normal range of motion Cardiovascular: Normal rate Pulmonary/chest: Effort normal Neurologic: Patient is alert Skin: Skin is warm Psychiatric: Patient has normal mood and affect  Ortho Exam:  Left shoulder Exam Left shoulder lacks 5 degrees of abduction and 10 degrees of external rotation compared with contralateral side.  Lacks about 5 vertebral levels of internal rotation behind the back compared with contralateral side.  No pain with passive range of motion.  No mechanical symptoms felt with passive range of motion. No TTP over the [facility] joint or bicipital groove Good subscapularis, supraspinatus, and infraspinatus strength Negative Hawkins impingement 5/5 grip strength, forearm pronation/supination, and bicep strength  Specialty Comments:  No specialty comments available.  Imaging: No results found.   PMFS History: Patient Active Problem List   Diagnosis Date Noted  . 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 Hx of menorrhagia 02/05/2019   Past Medical History:  Diagnosis Date  . H/O hereditary hemorrhagic  telangiectasia (HHT)   . History of IBS   . Thyroid disease     Family History  Problem Relation Age of Onset  . Stroke Mother   . Hypertension Mother   . Bladder Cancer Father   . Diabetes Mellitus I Sister   . Diabetes Mellitus II Sister   . Heart disease Maternal Grandfather   . Heart disease Paternal Grandfather   . Diabetes Mellitus I Paternal Grandfather   . Diabetes Mellitus II Sister   . Cervical cancer Sister     Past  Surgical History:  Procedure Laterality Date  . CESAREAN SECTION    . TONSILLECTOMY AND ADENOIDECTOMY    . uterine ablation     Social History   Occupational History  . Not on file  Tobacco Use  . Smoking status: Never Smoker  . Smokeless tobacco: Never Used  Substance and Sexual Activity  . Alcohol use: Yes  . Drug use: Never  . Sexual activity: Yes    Comment: declined insurance questions,

## 2020-02-08 ENCOUNTER — Other Ambulatory Visit: Payer: Self-pay

## 2020-02-11 ENCOUNTER — Ambulatory Visit (INDEPENDENT_AMBULATORY_CARE_PROVIDER_SITE_OTHER): Payer: 59 | Admitting: Nurse Practitioner

## 2020-02-11 ENCOUNTER — Other Ambulatory Visit: Payer: Self-pay

## 2020-02-11 ENCOUNTER — Encounter: Payer: Self-pay | Admitting: Nurse Practitioner

## 2020-02-11 VITALS — BP 110/80 | Ht 65.0 in | Wt 143.0 lb

## 2020-02-11 DIAGNOSIS — N898 Other specified noninflammatory disorders of vagina: Secondary | ICD-10-CM

## 2020-02-11 DIAGNOSIS — Z9889 Other specified postprocedural states: Secondary | ICD-10-CM | POA: Insufficient documentation

## 2020-02-11 DIAGNOSIS — E063 Autoimmune thyroiditis: Secondary | ICD-10-CM

## 2020-02-11 DIAGNOSIS — Z9851 Tubal ligation status: Secondary | ICD-10-CM | POA: Insufficient documentation

## 2020-02-11 DIAGNOSIS — E038 Other specified hypothyroidism: Secondary | ICD-10-CM | POA: Diagnosis not present

## 2020-02-11 DIAGNOSIS — Z01419 Encounter for gynecological examination (general) (routine) without abnormal findings: Secondary | ICD-10-CM

## 2020-02-11 MED ORDER — PREMARIN 0.625 MG/GM VA CREA: 1.0000 | TOPICAL_CREAM | Freq: Every day | VAGINAL | 12 refills | Status: DC

## 2020-02-11 NOTE — Progress Notes (Signed)
   Yarenis Cerino [facility] [birth year] [id number]   History:  49 y.o. G3 P3 presents for annual exam.  On Micronor, 2011 endometrial ablation for menorrhagia, bilateral tubal ligation.  Beginning to have irregular cycles, had a cycle 1 week ago but prior to this it had been 5/6 months.  No complaints of hot flashes, insomnia, but does complain of vaginal dryness.  Normal Pap and mammogram history.  Fibroadenoma of breast in 2018.  Gynecologic History Patient's last menstrual period was 02/04/2020. Period Pattern: (!) Irregular Menstrual Flow: Moderate Menstrual Control: Maxi pad, Tampon Dysmenorrhea: (!) Mild Dysmenorrhea Symptoms: Cramping Contraception: oral progesterone-only contraceptive Last Pap: 02/01/2018. Results were: normal Last mammogram: 06/19/2019. R/esults were: normal  Past medical history, past surgical history, family history and social history were all reviewed and documented in the EPIC chart.  ROS:  A ROS was performed and pertinent positives and negatives are included.  Exam:  Vitals:   02/11/20 1102  BP: 110/80  Weight: 143 lb (64.9 kg)  Height: 5\' 5"  (1.651 m)   Body mass index is 23.8 kg/m.  General appearance:  Normal Thyroid:  Symmetrical, enlarged, without palpable masses or nodularity. Respiratory  Auscultation:  Clear without wheezing or rhonchi Cardiovascular  Auscultation:  Regular rate, without rubs, murmurs or gallops  Edema/varicosities:  Not grossly evident Abdominal  Soft,nontender, without masses, guarding or rebound.  Liver/spleen:  No organomegaly noted  Hernia:  None appreciated  Skin  Inspection:  Grossly normal   Breasts: Examined lying and sitting.   Right: Without masses, retractions, discharge or axillary adenopathy.   Left: Without masses, retractions, discharge or axillary adenopathy. Gentitourinary   Inguinal/mons:  Normal without inguinal adenopathy  External genitalia:  Normal  BUS/Urethra/Skene's glands:  Normal  Vagina:   Normal  Cervix:  Normal  Uterus:  Anteverted, normal in size, shape and contour.  Midline and mobile  Adnexa/parametria:     Rt: Without masses or tenderness.   Lt: Without masses or tenderness.  Anus and perineum: Normal  Assessment/Plan:  49 y.o. G3 P3 for annual exam.  Well female exam with routine gynecological exam - Plan: CBC with Differential/Platelet, Comprehensive metabolic panel. Education provided on SBEs, importance of preventative screenings, current guidelines, high calcium diet, regular exercise, and multivitamin daily.   History of endometrial ablation - due to menorrhagia. Was having light monthly cycles until 5/6 months ago.   Vaginal dryness - Plan: conjugated estrogens (PREMARIN) vaginal cream. Instructed to take daily for 2 weeks, then decrease to every other day for 2 weeks, then twice weekly for maintenance.   Hypothyroidism due to Hashimoto's thyroiditis - Plan: TSH. No on medication  Follow up in 1 year for annual    [address] [facility], 11:07 AM 02/11/2020

## 2020-02-11 NOTE — Patient Instructions (Signed)
Health Maintenance, Female Adopting a healthy lifestyle and getting preventive care are important in promoting health and wellness. Ask your health care provider about:  The right schedule for you to have regular tests and exams.  Things you can do on your own to prevent diseases and keep yourself healthy. What should I know about diet, weight, and exercise? Eat a healthy diet   Eat a diet that includes plenty of vegetables, fruits, low-fat dairy products, and lean protein.  Do not eat a lot of foods that are high in solid fats, added sugars, or sodium. Maintain a healthy weight Body mass index (BMI) is used to identify weight problems. It estimates body fat based on height and weight. Your health care provider can help determine your BMI and help you achieve or maintain a healthy weight. Get regular exercise Get regular exercise. This is one of the most important things you can do for your health. Most adults should:  Exercise for at least 150 minutes each week. The exercise should increase your heart rate and make you sweat (moderate-intensity exercise).  Do strengthening exercises at least twice a week. This is in addition to the moderate-intensity exercise.  Spend less time sitting. Even light physical activity can be beneficial. Watch cholesterol and blood lipids Have your blood tested for lipids and cholesterol at 49 years of age, then have this test every 5 years. Have your cholesterol levels checked more often if:  Your lipid or cholesterol levels are high.  You are older than 49 years of age.  You are at high risk for heart disease. What should I know about cancer screening? Depending on your health history and family history, you may need to have cancer screening at various ages. This may include screening for:  Breast cancer.  Cervical cancer.  Colorectal cancer.  Skin cancer.  Lung cancer. What should I know about heart disease, diabetes, and high blood  pressure? Blood pressure and heart disease  High blood pressure causes heart disease and increases the risk of stroke. This is more likely to develop in people who have high blood pressure readings, are of African descent, or are overweight.  Have your blood pressure checked: ? Every 3-5 years if you are 18-39 years of age. ? Every year if you are 40 years old or older. Diabetes Have regular diabetes screenings. This checks your fasting blood sugar level. Have the screening done:  Once every three years after age 40 if you are at a normal weight and have a low risk for diabetes.  More often and at a younger age if you are overweight or have a high risk for diabetes. What should I know about preventing infection? Hepatitis B If you have a higher risk for hepatitis B, you should be screened for this virus. Talk with your health care provider to find out if you are at risk for hepatitis B infection. Hepatitis C Testing is recommended for:  Everyone born from 1945 through 1965.  Anyone with known risk factors for hepatitis C. Sexually transmitted infections (STIs)  Get screened for STIs, including gonorrhea and chlamydia, if: ? You are sexually active and are younger than 49 years of age. ? You are older than 49 years of age and your health care provider tells you that you are at risk for this type of infection. ? Your sexual activity has changed since you were last screened, and you are at increased risk for chlamydia or gonorrhea. Ask your health care provider if   you are at risk.  Ask your health care provider about whether you are at high risk for HIV. Your health care provider may recommend a prescription medicine to help prevent HIV infection. If you choose to take medicine to prevent HIV, you should first get tested for HIV. You should then be tested every 3 months for as long as you are taking the medicine. Pregnancy  If you are about to stop having your period (premenopausal) and  you may become pregnant, seek counseling before you get pregnant.  Take 400 to 800 micrograms (mcg) of folic acid every day if you become pregnant.  Ask for birth control (contraception) if you want to prevent pregnancy. Osteoporosis and menopause Osteoporosis is a disease in which the bones lose minerals and strength with aging. This can result in bone fractures. If you are 65 years old or older, or if you are at risk for osteoporosis and fractures, ask your health care provider if you should:  Be screened for bone loss.  Take a calcium or vitamin D supplement to lower your risk of fractures.  Be given hormone replacement therapy (HRT) to treat symptoms of menopause. Follow these instructions at home: Lifestyle  Do not use any products that contain nicotine or tobacco, such as cigarettes, e-cigarettes, and chewing tobacco. If you need help quitting, ask your health care provider.  Do not use street drugs.  Do not share needles.  Ask your health care provider for help if you need support or information about quitting drugs. Alcohol use  Do not drink alcohol if: ? Your health care provider tells you not to drink. ? You are pregnant, may be pregnant, or are planning to become pregnant.  If you drink alcohol: ? Limit how much you use to 0-1 drink a day. ? Limit intake if you are breastfeeding.  Be aware of how much alcohol is in your drink. In the [place] one drink equals one 12 oz bottle of beer (355 mL), one 5 oz glass of wine (148 mL), or one 1 oz glass of hard liquor (44 mL). General instructions  Schedule regular health, dental, and eye exams.  Stay current with your vaccines.  Tell your health care provider if: ? You often feel depressed. ? You have ever been abused or do not feel safe at home. Summary  Adopting a healthy lifestyle and getting preventive care are important in promoting health and wellness.  Follow your health care provider's instructions about healthy  diet, exercising, and getting tested or screened for diseases.  Follow your health care provider's instructions on monitoring your cholesterol and blood pressure. This information is not intended to replace advice given to you by your health care provider. Make sure you discuss any questions you have with your health care provider. Document Revised: 08/02/2018 Document Reviewed: 08/02/2018 Elsevier Patient Education  2020 Elsevier Inc.  

## 2020-02-12 LAB — CBC WITH DIFFERENTIAL/PLATELET
Absolute Monocytes: 413 cells/uL (ref 200–950)
Basophils Absolute: 29 cells/uL (ref 0–200)
Basophils Relative: 0.6 %
Eosinophils Absolute: 53 cells/uL (ref 15–500)
Eosinophils Relative: 1.1 %
HCT: 43.1 % (ref 35.0–45.0)
Hemoglobin: 14.5 g/dL (ref 11.7–15.5)
Lymphs Abs: 1541 cells/uL (ref 850–3900)
MCH: 31.6 pg (ref 27.0–33.0)
MCHC: 33.6 g/dL (ref 32.0–36.0)
MCV: 93.9 fL (ref 80.0–100.0)
MPV: 9.9 fL (ref 7.5–12.5)
Monocytes Relative: 8.6 %
Neutro Abs: 2765 cells/uL (ref 1500–7800)
Neutrophils Relative %: 57.6 %
Platelets: 322 10*3/uL (ref 140–400)
RBC: 4.59 10*6/uL (ref 3.80–5.10)
RDW: 11.9 % (ref 11.0–15.0)
Total Lymphocyte: 32.1 %
WBC: 4.8 10*3/uL (ref 3.8–10.8)

## 2020-02-12 LAB — COMPREHENSIVE METABOLIC PANEL
AG Ratio: 1.9 (calc) (ref 1.0–2.5)
ALT: 23 U/L (ref 6–29)
AST: 21 U/L (ref 10–35)
Albumin: 4.6 g/dL (ref 3.6–5.1)
Alkaline phosphatase (APISO): 124 U/L (ref 31–125)
BUN: 14 mg/dL (ref 7–25)
CO2: 26 mmol/L (ref 20–32)
Calcium: 10.2 mg/dL (ref 8.6–10.2)
Chloride: 104 mmol/L (ref 98–110)
Creat: 0.94 mg/dL (ref 0.50–1.10)
Globulin: 2.4 g/dL (calc) (ref 1.9–3.7)
Glucose, Bld: 105 mg/dL — ABNORMAL HIGH (ref 65–99)
Potassium: 4.5 mmol/L (ref 3.5–5.3)
Sodium: 140 mmol/L (ref 135–146)
Total Bilirubin: 0.9 mg/dL (ref 0.2–1.2)
Total Protein: 7 g/dL (ref 6.1–8.1)

## 2020-02-12 LAB — TSH: TSH: 2.1 mIU/L

## 2020-03-21 NOTE — Telephone Encounter (Signed)
Sherry Preston I will let her know that to use start using twice weekly and send a new Rx with new directions , how many grams does she insert?

## 2020-04-25 ENCOUNTER — Other Ambulatory Visit: Payer: Self-pay | Admitting: *Deleted

## 2020-04-25 NOTE — Telephone Encounter (Signed)
Patient had annual exam on 02/11/20, was she to continue pills?

## 2020-04-27 MED ORDER — NORETHINDRONE 0.35 MG PO TABS: ORAL_TABLET | ORAL | 2 refills | Status: DC

## 2020-07-10 ENCOUNTER — Other Ambulatory Visit: Payer: Self-pay | Admitting: Family Medicine

## 2020-07-10 DIAGNOSIS — R911 Solitary pulmonary nodule: Secondary | ICD-10-CM

## 2020-07-31 ENCOUNTER — Ambulatory Visit
Admission: RE | Admit: 2020-07-31 | Discharge: 2020-07-31 | Disposition: A | Discharge status: Home / Self Care | Payer: 59 | Source: Ambulatory Visit | Attending: Family Medicine | Admitting: Family Medicine

## 2020-07-31 DIAGNOSIS — R911 Solitary pulmonary nodule: Secondary | ICD-10-CM

## 2021-01-12 ENCOUNTER — Other Ambulatory Visit: Payer: Self-pay | Admitting: Nurse Practitioner

## 2021-01-12 NOTE — Telephone Encounter (Signed)
Annual exam scheduled on 02/25/21

## 2021-02-11 ENCOUNTER — Ambulatory Visit: Payer: 59 | Admitting: Nurse Practitioner

## 2021-02-25 ENCOUNTER — Other Ambulatory Visit: Payer: Self-pay

## 2021-02-25 ENCOUNTER — Encounter: Payer: Self-pay | Admitting: Nurse Practitioner

## 2021-02-25 ENCOUNTER — Ambulatory Visit (INDEPENDENT_AMBULATORY_CARE_PROVIDER_SITE_OTHER): Payer: 59 | Admitting: Nurse Practitioner

## 2021-02-25 VITALS — BP 116/72 | Ht 65.0 in | Wt 150.0 lb

## 2021-02-25 DIAGNOSIS — Z01419 Encounter for gynecological examination (general) (routine) without abnormal findings: Secondary | ICD-10-CM

## 2021-02-25 DIAGNOSIS — E038 Other specified hypothyroidism: Secondary | ICD-10-CM

## 2021-02-25 DIAGNOSIS — N898 Other specified noninflammatory disorders of vagina: Secondary | ICD-10-CM

## 2021-02-25 DIAGNOSIS — Z9889 Other specified postprocedural states: Secondary | ICD-10-CM | POA: Diagnosis not present

## 2021-02-25 DIAGNOSIS — N644 Mastodynia: Secondary | ICD-10-CM

## 2021-02-25 DIAGNOSIS — Z8639 Personal history of other endocrine, nutritional and metabolic disease: Secondary | ICD-10-CM

## 2021-02-25 DIAGNOSIS — N912 Amenorrhea, unspecified: Secondary | ICD-10-CM

## 2021-02-25 DIAGNOSIS — E063 Autoimmune thyroiditis: Secondary | ICD-10-CM

## 2021-02-25 MED ORDER — PREMARIN 0.625 MG/GM VA CREA: 1.0000 | TOPICAL_CREAM | Freq: Every day | VAGINAL | 12 refills | Status: DC

## 2021-02-25 NOTE — Progress Notes (Signed)
Sherry Preston [facility] 05-31-1971 [id number]   History:  50 y.o. G3 P3 presents for annual exam. 2011 endometrial ablation. Was started on Micronor due to frequent bleeding afterwards. Amenorrheic on Micronor. BTL. Using premarin for vaginal dryness. She complains of intermittent left breast pain that has been ongoing for many months. Pain is described as sore and feels her breast is slightly larger. Denies lump, redness, or nipple discharge. She has had a normal mammogram since pain started. She is wondering if it is the premarin. Normal Pap and mammogram history.  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 She has noticed more difficulty with swallowing lately and plans to follow up with endocrinology.  Gynecologic History No LMP recorded (lmp unknown).    Contraception: oral progesterone-only contraceptive Last Pap: 02/01/2018. Results were: normal Last mammogram: 06/2020. Results were: normal Last colonoscopy: Never Last Dexa: 06/2020 per patient. Results were: normal  Past medical history, past surgical history, family history and social history were all reviewed and documented in the EPIC chart. Married. SAHM. 3 sons ages 62, 34, and 59.   ROS:  A ROS was performed and pertinent positives and negatives are included.  Exam:  Vitals:   02/25/21 1135  BP: 116/72  Weight: 150 lb (68 kg)  Height: 5\' 5"  (1.651 m)    Body mass index is 24.96 kg/m.  General appearance:  Normal Thyroid:  Symmetrical, enlarged, without palpable masses or nodularity. Respiratory  Auscultation:  Clear without wheezing or rhonchi Cardiovascular  Auscultation:  Regular rate, without rubs, murmurs or gallops  Edema/varicosities:  Not grossly evident Abdominal  Soft,nontender, without masses, guarding or rebound.  Liver/spleen:  No organomegaly noted  Hernia:  None appreciated  Skin  Inspection:  Grossly normal   Breasts: Examined lying and  sitting.   Right: Without masses, retractions, discharge or axillary adenopathy.   Left: Without masses, retractions, discharge or axillary adenopathy.  No tenderness with palpation.  Gentitourinary   Inguinal/mons:  Normal without inguinal adenopathy  External genitalia:  Normal  BUS/Urethra/Skene's glands:  Normal  Vagina:  Normal  Cervix:  Normal  Uterus:  Anteverted, normal in size, shape and contour.  Midline and mobile  Adnexa/parametria:     Rt: Without masses or tenderness.   Lt: Without masses or tenderness.  Anus and perineum: Normal  Assessment/Plan:  50 y.o. G3 P3 for annual exam.  Well female exam with routine gynecological exam - Plan: CBC with Differential/Platelet, Comprehensive metabolic panel. Education provided on SBEs, importance of preventative screenings, current guidelines, high calcium diet, regular exercise, and multivitamin daily.  History of endometrial ablation - 2011. Had frequent bleeding after and is now amenorrheic on Micronor.   Vaginal dryness - Plan: conjugated estrogens (PREMARIN) vaginal cream twice weekly with good management.   Hypothyroidism due to Hashimoto's thyroiditis - Plan: TSH. Has not been o medication for years. Recommend following up with endocrinology. She was having ultrasounds every 6 months but stopped on her own years ago. She has noticed it feels like a lump in her throat when she swallows and plans to see them soon for evaluation.   Amenorrhea - Plan: Follicle stimulating hormone  History of vitamin D deficiency - Plan: VITAMIN D 25 Hydroxy (Vit-D Deficiency, Fractures)  Breast pain - She complains of intermittent left breast pain that has been ongoing for many months. Pain is described as sore and she feels left breast is slightly larger. Denies lump, redness, or nipple discharge. She has had  a normal mammogram since pain started. She is wondering if it is the premarin. Recommend stopping premarin to see if breast pain improves.  Also discussed that if she is pre/perimenopausal she may be experiencing ovulation symptoms as Micronor may not be suppressing ovulation consistently.  Screening for cervical cancer - Normal Pap history.  Will repeat at 5-year interval per guidelines.  Screening for breast cancer - Normal mammogram history.  Continue annual screenings.  Normal breast exam today.  Screening for colon cancer - Has not has screening colonoscopy. Discussed current guidelines and importance of preventative screenings. She plans to schedule this soon.   Follow up in 1 year for annual    Olivia Mackie [facility], 12:44 PM 02/25/2021

## 2021-02-27 LAB — CBC WITH DIFFERENTIAL/PLATELET
Absolute Monocytes: 535 cells/uL (ref 200–950)
Basophils Absolute: 32 cells/uL (ref 0–200)
Basophils Relative: 0.6 %
Eosinophils Absolute: 80 cells/uL (ref 15–500)
Eosinophils Relative: 1.5 %
HCT: 41 % (ref 35.0–45.0)
Hemoglobin: 13.8 g/dL (ref 11.7–15.5)
Lymphs Abs: 1479 cells/uL (ref 850–3900)
MCH: 32.2 pg (ref 27.0–33.0)
MCHC: 33.7 g/dL (ref 32.0–36.0)
MCV: 95.6 fL (ref 80.0–100.0)
MPV: 9.4 fL (ref 7.5–12.5)
Monocytes Relative: 10.1 %
Neutro Abs: 3175 cells/uL (ref 1500–7800)
Neutrophils Relative %: 59.9 %
Platelets: 306 10*3/uL (ref 140–400)
RBC: 4.29 10*6/uL (ref 3.80–5.10)
RDW: 11.8 % (ref 11.0–15.0)
Total Lymphocyte: 27.9 %
WBC: 5.3 10*3/uL (ref 3.8–10.8)

## 2021-02-27 LAB — TSH: TSH: 1.64 mIU/L

## 2021-02-27 LAB — COMPREHENSIVE METABOLIC PANEL
AG Ratio: 1.8 (calc) (ref 1.0–2.5)
ALT: 24 U/L (ref 6–29)
AST: 21 U/L (ref 10–35)
Albumin: 4.2 g/dL (ref 3.6–5.1)
Alkaline phosphatase (APISO): 108 U/L (ref 37–153)
BUN: 14 mg/dL (ref 7–25)
CO2: 26 mmol/L (ref 20–32)
Calcium: 9.5 mg/dL (ref 8.6–10.4)
Chloride: 104 mmol/L (ref 98–110)
Creat: 0.86 mg/dL (ref 0.50–1.05)
Globulin: 2.4 g/dL (calc) (ref 1.9–3.7)
Glucose, Bld: 115 mg/dL — ABNORMAL HIGH (ref 65–99)
Potassium: 4.5 mmol/L (ref 3.5–5.3)
Sodium: 138 mmol/L (ref 135–146)
Total Bilirubin: 0.7 mg/dL (ref 0.2–1.2)
Total Protein: 6.6 g/dL (ref 6.1–8.1)

## 2021-02-27 LAB — HEMOGLOBIN A1C
Hgb A1c MFr Bld: 4.8 % of total Hgb (ref <5.7)
Mean Plasma Glucose: 91 mg/dL
eAG (mmol/L): 5 mmol/L

## 2021-02-27 LAB — VITAMIN D 25 HYDROXY (VIT D DEFICIENCY, FRACTURES): Vit D, 25-Hydroxy: 61 ng/mL (ref 30–100)

## 2021-02-27 LAB — FOLLICLE STIMULATING HORMONE: FSH: 4.4 m[IU]/mL

## 2021-04-03 ENCOUNTER — Other Ambulatory Visit: Payer: Self-pay | Admitting: Nurse Practitioner

## 2021-04-03 NOTE — Telephone Encounter (Signed)
Annual exam scheduled on 02/25/21 

## 2021-05-28 ENCOUNTER — Encounter: Payer: Self-pay | Admitting: Nurse Practitioner

## 2021-05-29 MED ORDER — VALACYCLOVIR HCL 500 MG PO TABS: ORAL_TABLET | ORAL | 0 refills | Status: DC

## 2021-06-19 ENCOUNTER — Encounter: Payer: Self-pay | Admitting: Nurse Practitioner

## 2021-09-14 ENCOUNTER — Other Ambulatory Visit: Payer: Self-pay | Admitting: Nurse Practitioner

## 2021-09-14 ENCOUNTER — Other Ambulatory Visit: Payer: 59

## 2021-09-14 ENCOUNTER — Other Ambulatory Visit: Payer: Self-pay

## 2021-09-14 DIAGNOSIS — N951 Menopausal and female climacteric states: Secondary | ICD-10-CM

## 2021-09-14 LAB — ESTRADIOL: Estradiol: 21 pg/mL

## 2021-09-14 LAB — FOLLICLE STIMULATING HORMONE: FSH: 22.3 m[IU]/mL

## 2022-02-28 NOTE — Progress Notes (Unsigned)
   Sherry Preston [facility] [birth year] [id number]   History:  51 y.o. G3 P3 presents for annual exam. Amenorrheic on POPs. S/P 2011 endometrial ablation, frequent bleeding afterwards. Using premarin for vaginal dryness. Normal Pap and mammogram history.  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 She has noticed more difficulty with swallowing lately and plans to follow up with endocrinology.  Gynecologic History No LMP recorded (lmp unknown).    Contraception: oral progesterone-only contraceptive Sexually active: Yes  Health maintenance Last Pap: 02/01/2018. Results were: Normal, 5-year repeat Last mammogram: 06/2020. Results were: Normal Last colonoscopy: Never Last Dexa: 06/2020 per patient. Results were: Normal  Past medical history, past surgical history, family history and social history were all reviewed and documented in the EPIC chart. Married. SAHM. 3 sons ages 24, 82, and 46.   ROS:  A ROS was performed and pertinent positives and negatives are included.  Exam:  There were no vitals filed for this visit.   There is no height or weight on file to calculate BMI.  General appearance:  Normal Thyroid:  Symmetrical, enlarged, without palpable masses or nodularity. Respiratory  Auscultation:  Clear without wheezing or rhonchi Cardiovascular  Auscultation:  Regular rate, without rubs, murmurs or gallops  Edema/varicosities:  Not grossly evident Abdominal  Soft,nontender, without masses, guarding or rebound.  Liver/spleen:  No organomegaly noted  Hernia:  None appreciated  Skin  Inspection:  Grossly normal Breasts: Examined lying and sitting.   Right: Without masses, retractions, nipple discharge or axillary adenopathy.   Left: Without masses, retractions, nipple discharge or axillary adenopathy. Genitourinary   Inguinal/mons:  Normal without inguinal adenopathy  External genitalia:  Normal appearing vulva with no masses, tenderness,  or lesions  BUS/Urethra/Skene's glands:  Normal  Vagina:  Normal appearing with normal color and discharge, no lesions  Cervix:  Normal appearing without discharge or lesions  Uterus:  Normal in size, shape and contour.  Midline and mobile, nontender  Adnexa/parametria:     Rt: Normal in size, without masses or tenderness.   Lt: Normal in size, without masses or tenderness.  Anus and perineum: Normal  Digital rectal exam: Normal sphincter tone without palpated masses or tenderness  Patient informed chaperone available to be present for breast and pelvic exam. Patient has requested no chaperone to be present. Patient has been advised what will be completed during breast and pelvic exam.   Assessment/Plan:  51 y.o. G3 P3 for annual exam.    Vaginal dryness - Plan: conjugated estrogens (PREMARIN) vaginal cream twice weekly with good management.   Hypothyroidism due to Hashimoto's thyroiditis - Plan: TSH. Has not been on medication for years. Recommend following up with endocrinology. She was having ultrasounds every 6 months but stopped on her own years ago. She has noticed it feels like a lump in her throat when she swallows and plans to see them soon for evaluation.   History of vitamin D deficiency - Plan: VITAMIN D 25 Hydroxy (Vit-D Deficiency, Fractures)  Screening for cervical cancer - Normal Pap history.  Will repeat at 5-year interval per guidelines.  Screening for breast cancer - Normal mammogram history.  Continue annual screenings.  Normal breast exam today.  Screening for colon cancer - Has not has screening colonoscopy. Discussed current guidelines and importance of preventative screenings. She plans to schedule this soon.   Follow up in 1 year for annual    Olivia Mackie [facility], 7:52 PM 02/28/2022

## 2022-03-01 ENCOUNTER — Ambulatory Visit: Payer: 59 | Admitting: Nurse Practitioner

## 2022-03-01 ENCOUNTER — Ambulatory Visit (INDEPENDENT_AMBULATORY_CARE_PROVIDER_SITE_OTHER): Payer: 59 | Admitting: Nurse Practitioner

## 2022-03-01 ENCOUNTER — Encounter: Payer: Self-pay | Admitting: Nurse Practitioner

## 2022-03-01 VITALS — BP 110/78 | Ht 65.0 in | Wt 141.0 lb

## 2022-03-01 DIAGNOSIS — Z3041 Encounter for surveillance of contraceptive pills: Secondary | ICD-10-CM | POA: Diagnosis not present

## 2022-03-01 DIAGNOSIS — Z01419 Encounter for gynecological examination (general) (routine) without abnormal findings: Secondary | ICD-10-CM | POA: Diagnosis not present

## 2022-03-01 DIAGNOSIS — N898 Other specified noninflammatory disorders of vagina: Secondary | ICD-10-CM

## 2022-03-01 DIAGNOSIS — E559 Vitamin D deficiency, unspecified: Secondary | ICD-10-CM

## 2022-03-01 DIAGNOSIS — Z833 Family history of diabetes mellitus: Secondary | ICD-10-CM

## 2022-03-01 DIAGNOSIS — E038 Other specified hypothyroidism: Secondary | ICD-10-CM

## 2022-03-01 MED ORDER — NORETHINDRONE 0.35 MG PO TABS: 1.0000 | ORAL_TABLET | Freq: Every day | ORAL | 3 refills | Status: DC

## 2022-03-01 MED ORDER — ESTRADIOL 10 MCG VA TABS: 1.0000 | ORAL_TABLET | VAGINAL | 3 refills | Status: DC

## 2022-06-17 ENCOUNTER — Other Ambulatory Visit: Payer: Self-pay | Admitting: Family Medicine

## 2022-06-17 DIAGNOSIS — R932 Abnormal findings on diagnostic imaging of liver and biliary tract: Secondary | ICD-10-CM

## 2022-06-28 ENCOUNTER — Encounter: Payer: Self-pay | Admitting: Nurse Practitioner

## 2022-07-20 ENCOUNTER — Ambulatory Visit
Admission: RE | Admit: 2022-07-20 | Discharge: 2022-07-20 | Disposition: A | Discharge status: Home / Self Care | Payer: 59 | Source: Ambulatory Visit | Attending: Family Medicine | Admitting: Family Medicine

## 2022-07-20 ENCOUNTER — Other Ambulatory Visit: Payer: 59

## 2022-07-20 DIAGNOSIS — R932 Abnormal findings on diagnostic imaging of liver and biliary tract: Secondary | ICD-10-CM

## 2022-07-20 MED ORDER — GADOPICLENOL 0.5 MMOL/ML IV SOLN
6.5000 mL | Freq: Once | INTRAVENOUS | Status: AC | PRN
  Administered 2022-07-20: 6.5 mL via INTRAVENOUS

## 2022-12-23 ENCOUNTER — Other Ambulatory Visit: Payer: Self-pay | Admitting: Family Medicine

## 2022-12-23 DIAGNOSIS — R932 Abnormal findings on diagnostic imaging of liver and biliary tract: Secondary | ICD-10-CM

## 2023-01-06 ENCOUNTER — Encounter: Payer: Self-pay | Admitting: Family Medicine

## 2023-01-10 ENCOUNTER — Other Ambulatory Visit: Payer: 59

## 2023-02-08 ENCOUNTER — Ambulatory Visit
Admission: RE | Admit: 2023-02-08 | Discharge: 2023-02-08 | Disposition: A | Discharge status: Home / Self Care | Payer: 59 | Source: Ambulatory Visit | Attending: Family Medicine | Admitting: Family Medicine

## 2023-02-08 DIAGNOSIS — R932 Abnormal findings on diagnostic imaging of liver and biliary tract: Secondary | ICD-10-CM

## 2023-02-08 MED ORDER — GADOPICLENOL 0.5 MMOL/ML IV SOLN
7.0000 mL | Freq: Once | INTRAVENOUS | Status: AC | PRN
  Administered 2023-02-08: 7 mL via INTRAVENOUS

## 2023-03-03 ENCOUNTER — Ambulatory Visit: Payer: 59 | Admitting: Nurse Practitioner

## 2023-03-14 ENCOUNTER — Other Ambulatory Visit: Payer: Self-pay

## 2023-03-14 DIAGNOSIS — Z3041 Encounter for surveillance of contraceptive pills: Secondary | ICD-10-CM

## 2023-03-14 MED ORDER — NORETHINDRONE 0.35 MG PO TABS: 1.0000 | ORAL_TABLET | Freq: Every day | ORAL | 0 refills | Status: DC

## 2023-03-14 NOTE — Telephone Encounter (Signed)
Medication refill request: Micronor  Last AEX:  03/01/22 Next AEX: 03/23/23 Last MMG (if hormonal medication request): n/a Refill authorized: #[id number] with 0 rf to get her to her aex

## 2023-03-23 ENCOUNTER — Other Ambulatory Visit (HOSPITAL_COMMUNITY)
Admission: RE | Admit: 2023-03-23 | Discharge: 2023-03-23 | Disposition: A | Discharge status: Home / Self Care | Payer: 59 | Source: Ambulatory Visit | Attending: Nurse Practitioner | Admitting: Nurse Practitioner

## 2023-03-23 ENCOUNTER — Encounter: Payer: Self-pay | Admitting: Nurse Practitioner

## 2023-03-23 ENCOUNTER — Ambulatory Visit (INDEPENDENT_AMBULATORY_CARE_PROVIDER_SITE_OTHER): Payer: 59 | Admitting: Nurse Practitioner

## 2023-03-23 VITALS — BP 124/68 | HR 72 | Ht 65.5 in | Wt 157.0 lb

## 2023-03-23 DIAGNOSIS — N951 Menopausal and female climacteric states: Secondary | ICD-10-CM | POA: Diagnosis not present

## 2023-03-23 DIAGNOSIS — Z3041 Encounter for surveillance of contraceptive pills: Secondary | ICD-10-CM

## 2023-03-23 DIAGNOSIS — N898 Other specified noninflammatory disorders of vagina: Secondary | ICD-10-CM

## 2023-03-23 DIAGNOSIS — Z124 Encounter for screening for malignant neoplasm of cervix: Secondary | ICD-10-CM

## 2023-03-23 DIAGNOSIS — Z01419 Encounter for gynecological examination (general) (routine) without abnormal findings: Secondary | ICD-10-CM

## 2023-03-23 MED ORDER — ESTRADIOL 10 MCG VA TABS
1.0000 | ORAL_TABLET | VAGINAL | 3 refills | Status: DC
Start: 2023-03-24 — End: 2024-04-03

## 2023-03-23 NOTE — Progress Notes (Signed)
Sherry Preston [facility] 1971-03-09 [id number]   History:  52 y.o. G3 P3 presents for annual exam. Amenorrheic on POPs. S/P 2011 endometrial ablation, light frequent bleeding afterwards so she started POPs for management. Stopped using vaginal estrogen cream because it was messy but wants to start vaginal tablets. Having pain with intercourse. Using Replens but it is not helping. Having night sweats and low libido. Interested in HRT. Normal Pap and mammogram history.  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 Enlarged thyroid being followed by PCP. Sister passed last month of unknown causes. Waiting on medication investigation to be completed.   Gynecologic History No LMP recorded (lmp unknown).    Contraception: oral progesterone-only contraceptive and tubal ligation Sexually active: Yes  Health maintenance Last Pap: 02/01/2018. Results were: Normal neg HPV, 5-year repeat Last mammogram: 06/25/2022. Results were: Normal Last colonoscopy: 10/06/2021. Results were: Normal, 10-year recall Last Dexa: 06/2020 per patient. Results were: Normal  Past medical history, past surgical history, family history and social history were all reviewed and documented in the EPIC chart. Married. SAHM. 3 sons ages 39, 40, and 40. 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 Youngest plays travel baseball.   ROS:  A ROS was performed and pertinent positives and negatives are included.  Exam:  Vitals:   03/23/23 1216  BP: 124/68  Pulse: 72  SpO2: 98%  Weight: 157 lb (71.2 kg)  Height: 5' 5.5" (1.664 m)      Body mass index is 25.73 kg/m.  General appearance:  Normal Thyroid:  Symmetrical, enlarged, without palpable masses or nodularity. Respiratory  Auscultation:  Clear without wheezing or rhonchi Cardiovascular  Auscultation:  Regular rate, without rubs, murmurs or gallops  Edema/varicosities:  Not grossly  evident Abdominal  Soft,nontender, without masses, guarding or rebound.  Liver/spleen:  No organomegaly noted  Hernia:  None appreciated  Skin  Inspection:  Grossly normal Breasts: Examined lying and sitting.   Right: Without masses, retractions, nipple discharge or axillary adenopathy.   Left: Without masses, retractions, nipple discharge or axillary adenopathy. Genitourinary   Inguinal/mons:  Normal without inguinal adenopathy  External genitalia:  Normal appearing vulva with no masses, tenderness, or lesions  BUS/Urethra/Skene's glands:  Normal  Vagina:  Normal appearing with normal color and discharge, no lesions  Cervix:  Normal appearing without discharge or lesions  Uterus:  Normal in size, shape and contour.  Midline and mobile, nontender  Adnexa/parametria:     Rt: Normal in size, without masses or tenderness.   Lt: Normal in size, without masses or tenderness.  Anus and perineum: Normal  Digital rectal exam: Not indicated  Patient informed chaperone available to be present for breast and pelvic exam. Patient has requested no chaperone to be present. Patient has been advised what will be completed during breast and pelvic exam.   Assessment/Plan:  52 y.o. G3 P3 for annual exam.  Well female exam with routine gynecological exam - Education provided on SBEs, importance of preventative screenings, current guidelines, high calcium diet, regular exercise, and multivitamin daily. Labs with PCP.   Encounter for surveillance of contraceptive pills - POPs. Started taking after endometrial ablation in 2011, had daily spotting. Doing well on this. Will provide refills after labs if appropriate.   Vaginal dryness - Plan: Estradiol 10 MCG TABS vaginal tablet twice weekly. . Recommend consistent use for best results. Coconut oil or [place] Dec for intercourse.   Screening for cervical cancer - Plan:  Cytology - PAP( [place]). Normal pap history.   Perimenopausal vasomotor symptoms -  Plan: Estradiol, Follicle stimulating hormone, Testos,Total,Free and SHBG (Female). Considering HRT.  Screening for breast cancer - Normal mammogram history.  Continue annual screenings.  Normal breast exam today.  Screening for colon cancer - 09/2021 colonoscopy. Will repeat at 10-year interval per guidelines.   Screening for osteoporosis - Normal DXA in 2021. Will repeat at 5-year interval per recommendation.   Follow up in 1 year for annual.    Olivia Mackie [facility], 12:39 PM 03/23/2023

## 2023-03-28 ENCOUNTER — Other Ambulatory Visit: Payer: Self-pay

## 2023-03-28 DIAGNOSIS — Z3041 Encounter for surveillance of contraceptive pills: Secondary | ICD-10-CM

## 2023-03-28 MED ORDER — NORETHINDRONE 0.35 MG PO TABS
1.0000 | ORAL_TABLET | Freq: Every day | ORAL | 3 refills | Status: DC
Start: 2023-03-28 — End: 2024-04-06

## 2023-03-28 NOTE — Telephone Encounter (Signed)
Medication refill request: Sherry Preston  Last AEX:  03/23/23  Next AEX: 03/27/24 Last MMG (if hormonal medication request): n/a Refill authorized: #[id number] with 3 rf pended for today

## 2023-04-06 ENCOUNTER — Other Ambulatory Visit: Payer: Self-pay

## 2023-04-06 DIAGNOSIS — N951 Menopausal and female climacteric states: Secondary | ICD-10-CM

## 2023-04-06 MED ORDER — NONFORMULARY OR COMPOUNDED ITEM
0 refills | Status: DC
Start: 2023-04-06 — End: 2023-06-24

## 2023-05-26 ENCOUNTER — Encounter: Payer: Self-pay | Admitting: Nurse Practitioner

## 2023-05-26 ENCOUNTER — Ambulatory Visit: Payer: 59 | Admitting: Nurse Practitioner

## 2023-05-26 VITALS — BP 112/78 | HR 64

## 2023-05-26 DIAGNOSIS — R6882 Decreased libido: Secondary | ICD-10-CM

## 2023-05-26 DIAGNOSIS — N898 Other specified noninflammatory disorders of vagina: Secondary | ICD-10-CM

## 2023-05-26 DIAGNOSIS — N951 Menopausal and female climacteric states: Secondary | ICD-10-CM

## 2023-05-26 NOTE — Progress Notes (Signed)
   Acute Office Visit  Subjective:    Patient ID: Sherry Preston, female    DOB: 20-May-1971, 52 y.o.   MRN: [id number]   HPI 52 y.o. G3P0003 presents today for follow up on medications. Starting testosterone gel for low libido and restarted vaginal estrogen for painful intercourse. Has noticed much improvement in both libido and painful intercourse. Taking POPs, amenorrheic. FSH 13, estradiol 186, free and total testosterone on low end. Stopped Paxil because she feels current HRT regimen is working well.   No LMP recorded. Patient is perimenopausal.    Review of Systems  Constitutional: Negative.   Genitourinary: Negative.        Objective:    Physical Exam Constitutional:      Appearance: Normal appearance.   GU: Not indicated  BP 112/78   Pulse 64   SpO2 99%  Wt Readings from Last 3 Encounters:  03/23/23 157 lb (71.2 kg)  03/01/22 141 lb (64 kg)  02/25/21 150 lb (68 kg)        Assessment & Plan:   Problem List Items Addressed This Visit   None Visit Diagnoses     Vaginal dryness    -  Primary   Low libido       Relevant Orders   Testos,Total,Free and SHBG (Female)   Perimenopausal vasomotor symptoms          Plan: Happy with current regimen. Will continue. Testosterone panel pending.      Olivia Mackie DNP, 11:52 AM 05/26/2023

## 2023-06-01 LAB — TESTOS,TOTAL,FREE AND SHBG (FEMALE)
Free Testosterone: 3.3 pg/mL (ref 0.1–6.4)
Sex Hormone Binding: 34.4 nmol/L (ref 17–124)
Testosterone, Total, LC-MS-MS: 28 ng/dL (ref 2–45)

## 2023-06-24 ENCOUNTER — Other Ambulatory Visit: Payer: Self-pay

## 2023-06-24 DIAGNOSIS — N951 Menopausal and female climacteric states: Secondary | ICD-10-CM

## 2023-06-24 NOTE — Telephone Encounter (Signed)
Med refill request: compounded testosterone cream [facility] Last AEX: 03/23/23 Next AEX: none Last MMG (if hormonal med) n/a Refill authorized: compounded testosterone cream (will need to be called in once approved).  Sent to provider for review.

## 2023-06-27 MED ORDER — NONFORMULARY OR COMPOUNDED ITEM
2 refills | Status: DC
Start: 2023-06-27 — End: 2024-04-27

## 2023-07-12 ENCOUNTER — Telehealth: Payer: Self-pay | Admitting: Nurse Practitioner

## 2023-07-12 NOTE — Telephone Encounter (Signed)
Spoke w/ [facility] @ [facility] and provided v/o of electronic rx.   Pt notified and voiced understanding and appreciation.

## 2023-07-12 NOTE — Telephone Encounter (Signed)
MM: "Med refill request: compounded testosterone cream  [facility]  Last AEX: 03/23/23  Next AEX: none  Last MMG (if hormonal med) n/a  Refill authorized: compounded testosterone cream (will need to be called in once approved).    Patients 3 rd call and needs refilled today as she is going out of town tomorrow AM."  Per TW:  "Sherry Preston, can you call this in for her? It was signed on 11/4, just not called in."   V/o for electronic rx called into [facility]. Documentation in rx refill request.   Pt notified and appreciated the call.  Routing to provider for final review and closing encounter.

## 2023-07-27 ENCOUNTER — Other Ambulatory Visit (HOSPITAL_COMMUNITY): Payer: Self-pay | Admitting: Family Medicine

## 2023-07-27 DIAGNOSIS — R011 Cardiac murmur, unspecified: Secondary | ICD-10-CM

## 2023-08-09 ENCOUNTER — Ambulatory Visit (HOSPITAL_COMMUNITY)
Admission: RE | Admit: 2023-08-09 | Discharge: 2023-08-09 | Disposition: A | Discharge status: Home / Self Care | Payer: 59 | Source: Ambulatory Visit | Attending: Family Medicine | Admitting: Family Medicine

## 2023-08-09 DIAGNOSIS — R011 Cardiac murmur, unspecified: Secondary | ICD-10-CM

## 2023-08-09 NOTE — Progress Notes (Signed)
*  PRELIMINARY RESULTS* Echocardiogram 2D Echocardiogram has been performed.  Sherry Preston 08/09/2023, 9:33 AM

## 2023-08-10 LAB — ECHOCARDIOGRAM COMPLETE
AR max vel: 3.71 cm2
AV Area VTI: 3.76 cm2
AV Area mean vel: 3.55 cm2
AV Mean grad: 4 mm[Hg]
AV Peak grad: 7.7 mm[Hg]
Ao pk vel: 1.39 m/s
Area-P 1/2: 4.19 cm2
S' Lateral: 3.5 cm

## 2023-09-15 ENCOUNTER — Other Ambulatory Visit: Payer: Self-pay | Admitting: Nurse Practitioner

## 2023-09-15 MED ORDER — VALACYCLOVIR HCL 500 MG PO TABS: ORAL_TABLET | ORAL | 1 refills | Status: DC

## 2023-09-15 NOTE — Telephone Encounter (Signed)
Med refill request: Valtrex Last OV: 05/26/23 Next AEX: 03/27/24 Last MMG (if hormonal med) 06/25/22 Refill authorized: Please Advise?

## 2023-10-11 ENCOUNTER — Encounter: Payer: Self-pay | Admitting: Nurse Practitioner

## 2023-10-11 ENCOUNTER — Telehealth: Payer: Self-pay

## 2023-10-11 NOTE — Telephone Encounter (Signed)
 Returned patient's call regarding prescription for testosterone gel.  Patient was informed this was a controlled prescription and was called to [facility] with 2 refills 06/27/23 and this was the reason she could not request refill through MyChart.  Patient will contact [facility] [phone] to have prescription filled. Patient verbalized understanding and agreed.

## 2024-03-27 ENCOUNTER — Ambulatory Visit: Payer: 59 | Admitting: Nurse Practitioner

## 2024-04-03 ENCOUNTER — Other Ambulatory Visit: Payer: Self-pay

## 2024-04-03 DIAGNOSIS — N898 Other specified noninflammatory disorders of vagina: Secondary | ICD-10-CM

## 2024-04-03 MED ORDER — ESTRADIOL 10 MCG VA TABS
1.0000 | ORAL_TABLET | VAGINAL | 0 refills | Status: DC
Start: 2024-04-05 — End: 2024-04-27

## 2024-04-03 NOTE — Telephone Encounter (Signed)
.  Med refill request: Estradiol    Last AEX: 03/23/23 Next AEX: 04/27/24 Last MMG (if hormonal med) 06/25/22 Refill authorized: Please Advise?

## 2024-04-06 ENCOUNTER — Other Ambulatory Visit: Payer: Self-pay

## 2024-04-06 DIAGNOSIS — Z3041 Encounter for surveillance of contraceptive pills: Secondary | ICD-10-CM

## 2024-04-06 NOTE — Telephone Encounter (Signed)
 Med refill request: Micronor   Last AEX: 03/23/23 Next AEX: 04/27/24 Last MMG (if hormonal med) 06/25/22 BIRADS Cat 1 neg  Refill authorized: last rx 03/28/23 #84 with 3 refills. Please approve or deny

## 2024-04-09 MED ORDER — NORETHINDRONE 0.35 MG PO TABS
1.0000 | ORAL_TABLET | Freq: Every day | ORAL | 0 refills | Status: DC
Start: 2024-04-09 — End: 2024-04-27

## 2024-04-11 NOTE — Telephone Encounter (Signed)
 Pt is requesting a 90 day supply on the estradiol    Please advise   Script was sent in on 04/05/24

## 2024-04-27 ENCOUNTER — Telehealth: Payer: Self-pay

## 2024-04-27 ENCOUNTER — Encounter: Payer: Self-pay | Admitting: Nurse Practitioner

## 2024-04-27 ENCOUNTER — Ambulatory Visit (INDEPENDENT_AMBULATORY_CARE_PROVIDER_SITE_OTHER): Admitting: Nurse Practitioner

## 2024-04-27 VITALS — BP 120/78 | HR 82 | Ht 65.0 in | Wt 163.6 lb

## 2024-04-27 DIAGNOSIS — Z1331 Encounter for screening for depression: Secondary | ICD-10-CM

## 2024-04-27 DIAGNOSIS — N941 Unspecified dyspareunia: Secondary | ICD-10-CM

## 2024-04-27 DIAGNOSIS — Z01419 Encounter for gynecological examination (general) (routine) without abnormal findings: Secondary | ICD-10-CM

## 2024-04-27 DIAGNOSIS — Z3041 Encounter for surveillance of contraceptive pills: Secondary | ICD-10-CM

## 2024-04-27 DIAGNOSIS — R6882 Decreased libido: Secondary | ICD-10-CM

## 2024-04-27 DIAGNOSIS — N898 Other specified noninflammatory disorders of vagina: Secondary | ICD-10-CM

## 2024-04-27 DIAGNOSIS — B009 Herpesviral infection, unspecified: Secondary | ICD-10-CM

## 2024-04-27 DIAGNOSIS — N951 Menopausal and female climacteric states: Secondary | ICD-10-CM

## 2024-04-27 MED ORDER — NORETHINDRONE 0.35 MG PO TABS: 1.0000 | ORAL_TABLET | Freq: Every day | ORAL | 3 refills | Status: AC

## 2024-04-27 MED ORDER — IMVEXXY MAINTENANCE PACK 10 MCG VA INST: 1.0000 | VAGINAL_INSERT | VAGINAL | 11 refills | Status: AC

## 2024-04-27 MED ORDER — VALACYCLOVIR HCL 500 MG PO TABS: ORAL_TABLET | ORAL | 1 refills | Status: AC

## 2024-04-27 NOTE — Telephone Encounter (Signed)
-----   Message from [name] Shutter sent at 04/27/2024  3:24 PM EDT ----- Regarding: testosterone gel Please call in Testosterone gel 2% to [facility]. 30 mg #4 refills.  Instructions: Apply pea-size amount to thigh nightly. Wash hands thoroughly afterwards, let dry fully before putting clothes on.

## 2024-04-27 NOTE — Telephone Encounter (Signed)
 Rx called in

## 2024-04-27 NOTE — Progress Notes (Signed)
 Sherry Preston [facility] September 09, 1970 [id number]   History:  53 y.o. G3 P3 presents for annual exam. Amenorrheic on POPs. S/P 2011 endometrial ablation, light frequent bleeding afterwards so she started POPs for management. Vaginal estrogen for dryness and painful intercourse. Some improvement but still painful. Using silicone lubricant. Testosterone for low libido, fatigue. Little improvement with 1%. Normal Pap history.  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   Gynecologic History No LMP recorded (lmp unknown).    Contraception: oral progesterone-only contraceptive and tubal ligation Sexually active: Yes  Health maintenance Last Pap: 03/23/2023. Results were: Normal neg HPV Last mammogram: 05/2023. Results were: Normal Last colonoscopy: 10/06/2021. Results were: Normal, 10-year recall Last Dexa: 06/2020 per patient. Results were: Normal     04/27/2024    3:03 PM  Depression screen PHQ 2/9  Decreased Interest 0  Down, Depressed, Hopeless 0  PHQ - 2 Score 0     Past medical history, past surgical history, family history and social history were all reviewed and documented in the EPIC chart. Married. SAHM. 3 sons ages 75, 19, and 48. Oldest graduated from [employer], thinking grad school. 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 Youngest age 55.   ROS:  A ROS was performed and pertinent positives and negatives are included.  Exam:  Vitals:   04/27/24 1457  BP: 120/78  Pulse: 82  SpO2: 100%  Weight: 163 lb 9.6 oz (74.2 kg)  Height: 5' 5 (1.651 m)       Body mass index is 27.22 kg/m.  General appearance:  Normal Thyroid :  Symmetrical, enlarged, without palpable masses or nodularity. Respiratory  Auscultation:  Clear without wheezing or rhonchi Cardiovascular  Auscultation:  Regular rate, without rubs, murmurs or gallops  Edema/varicosities:  Not grossly evident Abdominal  Soft,nontender, without masses, guarding or rebound.  Liver/spleen:  No  organomegaly noted  Hernia:  None appreciated  Skin  Inspection:  Grossly normal Breasts: Examined lying and sitting.   Right: Without masses, retractions, nipple discharge or axillary adenopathy.   Left: Without masses, retractions, nipple discharge or axillary adenopathy. Pelvic: External genitalia:  no lesions              Urethra:  normal appearing urethra with no masses, tenderness or lesions              Bartholins and Skenes: normal                 Vagina: normal appearing vagina with normal color and discharge, no lesions              Cervix: no lesions Bimanual Exam:  Uterus:  no masses or tenderness              Adnexa: no mass, fullness, tenderness              Rectovaginal: Deferred              Anus:  normal, no lesions  Kari Leaven, CMA present as chaperone.   Assessment/Plan:  53 y.o. G3 P3 for annual exam.  Well female exam with routine gynecological exam - Education provided on SBEs, importance of preventative screenings, current guidelines, high calcium diet, regular exercise, and multivitamin daily. Labs with PCP.   Low libido - very little improvement with 1% compounded testosterone gel. Will increase to 2%. Apply pea size amount to thigh nightly.   Encounter for surveillance of contraceptive pills - Plan: norethindrone  (MICRONOR ) 0.35 MG tablet  daily. Amenorrheic.   Vaginal dryness - Plan: Estradiol  (IMVEXXY  MAINTENANCE PACK) 10 MCG INST twice weekly.   Dyspareunia in female - Plan: Estradiol  (IMVEXXY  MAINTENANCE PACK) 10 MCG INST twice weekly. Using silicone lubricant. Still had pain with estradiol  vaginal tablet.   HSV (herpes simplex virus) infection - Plan: valACYclovir  (VALTREX ) 500 MG tablet as needed.   Screening for cervical cancer - Normal pap history. Will repeat at 5-year interval per guidelines.   Screening for breast cancer - Normal mammogram history. Overdue? Continue annual screenings.  Normal breast exam today.  Screening for colon cancer -  09/2021 colonoscopy. Will repeat at 10-year interval per guidelines.   Screening for osteoporosis - Normal DXA in 2021. Will repeat at 5-year interval per recommendation.   Return in about 1 year (around 04/27/2025) for Annual.    Sherry Preston Shutter [facility], 3:30 PM 04/27/2024

## 2024-06-27 ENCOUNTER — Other Ambulatory Visit: Payer: Self-pay | Admitting: Nurse Practitioner

## 2024-06-27 DIAGNOSIS — N898 Other specified noninflammatory disorders of vagina: Secondary | ICD-10-CM

## 2024-06-27 NOTE — Telephone Encounter (Signed)
 Refused the request for the Estradiol  10 MCG 18S medication was changed to Imvexxy  on 04/27/24.  Sent message to the pharmacy

## 2025-05-02 ENCOUNTER — Ambulatory Visit: Admitting: Nurse Practitioner
